# Patient Record
Sex: Female | Born: 2000 | Race: White | Hispanic: No | Marital: Single | Smoking: Current every day smoker
Health system: Southern US, Community
[De-identification: ages and names within clinical notes are randomized; demographics above are authoritative.]

## PROBLEM LIST (undated history)

## (undated) DIAGNOSIS — J189 Pneumonia, unspecified organism: Secondary | ICD-10-CM

## (undated) DIAGNOSIS — T7840XA Allergy, unspecified, initial encounter: Secondary | ICD-10-CM

## (undated) DIAGNOSIS — F329 Major depressive disorder, single episode, unspecified: Secondary | ICD-10-CM

## (undated) DIAGNOSIS — M419 Scoliosis, unspecified: Secondary | ICD-10-CM

## (undated) DIAGNOSIS — F32A Depression, unspecified: Secondary | ICD-10-CM

## (undated) DIAGNOSIS — F419 Anxiety disorder, unspecified: Secondary | ICD-10-CM

## (undated) HISTORY — PX: WISDOM TOOTH EXTRACTION: SHX21

## (undated) HISTORY — PX: TONSILLECTOMY: SUR1361

---
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
## 2019-02-01 ENCOUNTER — Telehealth: Payer: Self-pay | Admitting: Family Medicine

## 2019-02-01 NOTE — Telephone Encounter (Signed)
-----   Message from Shelly Coss, RN sent at 01/21/2019 11:57 AM EDT ----- Patient's mother called and is requesting appt for her daughter- please call her. Thanks!

## 2019-02-01 NOTE — Telephone Encounter (Signed)
Attempted to call patient to get her scheduled for an appointment her mother requested. Left a message for her to call back and get scheduled.

## 2019-02-16 ENCOUNTER — Telehealth: Payer: Self-pay | Admitting: Family Medicine

## 2019-02-16 NOTE — Telephone Encounter (Signed)
Patient was called and instructed about her visit for 02/17/2019.

## 2019-02-17 ENCOUNTER — Ambulatory Visit (INDEPENDENT_AMBULATORY_CARE_PROVIDER_SITE_OTHER): Payer: Medicaid Other | Admitting: Obstetrics and Gynecology

## 2019-02-17 ENCOUNTER — Other Ambulatory Visit: Payer: Self-pay

## 2019-02-17 ENCOUNTER — Encounter: Payer: Self-pay | Admitting: Obstetrics and Gynecology

## 2019-02-17 VITALS — BP 111/75 | HR 49 | Wt 176.0 lb

## 2019-02-17 DIAGNOSIS — Z30011 Encounter for initial prescription of contraceptive pills: Secondary | ICD-10-CM | POA: Diagnosis not present

## 2019-02-17 DIAGNOSIS — N83201 Unspecified ovarian cyst, right side: Secondary | ICD-10-CM | POA: Diagnosis not present

## 2019-02-17 DIAGNOSIS — R102 Pelvic and perineal pain: Secondary | ICD-10-CM | POA: Diagnosis not present

## 2019-02-17 MED ORDER — NORETHIN ACE-ETH ESTRAD-FE 1-20 MG-MCG(24) PO TABS: 1.0000 | ORAL_TABLET | Freq: Every day | ORAL | 11 refills | Status: DC

## 2019-02-17 NOTE — Progress Notes (Signed)
GYNECOLOGY OFFICE VISIT NOTE  History:  18 y.o. G0P0 here today for right pelvic pain. She has intermittent pain in right pelvis. Pain lasts 30-60 mins, sometimes can be a day apart, sometimes a few weeks apart. Sitting in the bathtub helps. Has been occurring for about 2 months. Rates it 10/10 when it happens. States she gets very tense when this happens. Has regular bowel movements, has been constipated in the last week or so. Routinely has 2-3 BM per day, formed and not requiring straining. No further issues with UTIs. Having normal monthly periods, starting beginning of the month. Lasting 5-7 days. She had nexplanon removed two months ago, which has helped with her cramps.     Past Medical History:  Diagnosis Date  . Allergy    POLLIN  . Anxiety   . Depression   . Pneumonia    AGE 72  . Scoliosis    SLIGHT    Past Surgical History:  Procedure Laterality Date  . ABDOMINAL HYSTERECTOMY Left 12/04/2017   Procedure: ABDOMINAL OVARIAN CYSTECTOMY;  Surgeon: Sloan Leiter, MD;  Location: [facility];  Service: Gynecology;  Laterality: Left;  ovarian cyst removal THIS IS NOT A HYSTERECTOMY  . TONSILLECTOMY    . WISDOM TOOTH EXTRACTION       Current Outpatient Medications:  .  etonogestrel-ethinyl estradiol (NUVARING) 0.12-0.015 MG/24HR vaginal ring, Insert vaginally and leave in place for 4 consecutive weeks, then remove and put in a new one., Disp: 1 each, Rfl: 12 .  Fe Bisgly-Succ-C-Thre-B12-FA (IRON-150 PO), Take by mouth., Disp: , Rfl:  .  ibuprofen (ADVIL,MOTRIN) 600 MG tablet, Take 1 tablet (600 mg total) by mouth every 6 (six) hours as needed., Disp: 30 tablet, Rfl: 0 .  loratadine (CLARITIN) 10 MG tablet, Take 10 mg by mouth daily as needed for allergies. , Disp: , Rfl:  .  oxyCODONE-acetaminophen (PERCOCET/ROXICET) 5-325 MG tablet, Take 1 tablet by mouth every 4 (four) hours as needed for severe pain., Disp: 15 tablet, Rfl: 0 .  Potassium 75 MG TABS, Take by mouth. Over the  counter, Disp: , Rfl:  .  sertraline (ZOLOFT) 100 MG tablet, Take 150 mg by mouth daily. , Disp: , Rfl: 0  The following portions of the patient's history were reviewed and updated as appropriate: allergies, current medications, past family history, past medical history, past social history, past surgical history and problem list.   Review of Systems:  Pertinent items noted in HPI and remainder of comprehensive ROS otherwise negative.   Objective:  Physical Exam BP 111/75   Pulse (!) 49   Wt 176 lb (79.8 kg)   BMI 29.29 kg/m  CONSTITUTIONAL: Well-developed, well-nourished female in no acute distress.  HENT:  Normocephalic, atraumatic. External right and left ear normal. Oropharynx is clear and moist EYES: Conjunctivae and EOM are normal. Pupils are equal, round, and reactive to light. No scleral icterus.  NECK: Normal range of motion, supple, no masses SKIN: Skin is warm and dry. No rash noted. Not diaphoretic. No erythema. No pallor. NEUROLOGIC: Alert and oriented to person, place, and time. Normal reflexes, muscle tone coordination. No cranial nerve deficit noted. PSYCHIATRIC: Normal mood and affect. Normal behavior. Normal judgment and thought content. CARDIOVASCULAR: Normal heart rate noted RESPIRATORY: Effort normal, no problems with respiration noted ABDOMEN: Soft, no distention noted. Pain not currently occurring, no tenderness with palpation.   PELVIC: deferred MUSCULOSKELETAL: Normal range of motion. No edema noted.  Labs and Imaging No results found.  Assessment &  Plan:   1. Pelvic pain - Reviewed possible etiologies, likely hemorrhagic cyst, for which treatment would be supportive, reviewed reasons to present to ED, particularly with severe pain. Also possibility of GI source, although patient has consistent BM and localizes pain to right pelvis.  - will repeat US to see if cyst noted on 09/2018 ultrasound is present/enlarged - US PELVIC COMPLETE WITH TRANSVAGINAL;  Future - POCT urine pregnancy  2. Encounter for initial prescription of contraceptive pills Using condoms, reviewed not as effective as nexplanon, would like OCPs, sent to pharmacy  3. Cyst of right ovary Repeat US   Routine preventative health maintenance measures emphasized. Please refer to After Visit Summary for other counseling recommendations.   No follow-ups on file.   Total face-to-face time with patient: 15 minutes. Over 50% of encounter was spent on counseling and coordination of care.   Feliz Beam, M.D. Attending Center for [employer] [facility])

## 2019-02-25 ENCOUNTER — Ambulatory Visit (HOSPITAL_COMMUNITY): Payer: Medicaid Other

## 2019-03-29 ENCOUNTER — Ambulatory Visit (HOSPITAL_COMMUNITY)
Admission: RE | Admit: 2019-03-29 | Discharge: 2019-03-29 | Disposition: A | Discharge status: Home / Self Care | Payer: Medicaid Other | Source: Ambulatory Visit | Attending: Obstetrics and Gynecology | Admitting: Obstetrics and Gynecology

## 2019-03-29 ENCOUNTER — Other Ambulatory Visit: Payer: Self-pay | Admitting: Obstetrics and Gynecology

## 2019-03-29 ENCOUNTER — Other Ambulatory Visit: Payer: Self-pay

## 2019-03-29 DIAGNOSIS — R102 Pelvic and perineal pain: Secondary | ICD-10-CM | POA: Insufficient documentation

## 2019-04-26 ENCOUNTER — Telehealth: Payer: Self-pay | Admitting: *Deleted

## 2019-04-26 DIAGNOSIS — N949 Unspecified condition associated with female genital organs and menstrual cycle: Secondary | ICD-10-CM

## 2019-04-26 DIAGNOSIS — N9489 Other specified conditions associated with female genital organs and menstrual cycle: Secondary | ICD-10-CM

## 2019-04-26 DIAGNOSIS — R102 Pelvic and perineal pain: Secondary | ICD-10-CM

## 2019-04-26 NOTE — Telephone Encounter (Signed)
I called Tanayah's home number.  and heard a message voicemail box is full and cannot accept messages.  I called Jillien's mobile number and a female answered and said Kanesha not there. I asked her to tell her we are trying to reach her and to please call us back and may ask for me or speak to any clinical person. She said best number to reach her is [phone]. She said Shamara  Has been trying to reach [place].  Linda,RN

## 2019-04-26 NOTE — Telephone Encounter (Signed)
-----   Message from Sloan Leiter, MD sent at 04/24/2019  1:48 AM EDT ----- Please let patient know she has a right sided adnexal mass, unclear on [place] and I recommend we do an MR to assess what this is as this may be the source of her pain.

## 2019-04-27 ENCOUNTER — Telehealth: Payer: Self-pay | Admitting: Obstetrics and Gynecology

## 2019-04-27 NOTE — Telephone Encounter (Signed)
Patient is returning a call from the nurse.

## 2019-04-28 NOTE — Telephone Encounter (Signed)
Called patient at home number, no answer- left message to call us back for results. Called mobile number and a women answered stating she wasn't in right now. Asked she have the patient call us back, she states she will.

## 2019-04-28 NOTE — Telephone Encounter (Signed)
Morgan Duke called back and I informed her results and reccommedation per Dr.Davis. she voices understanding and is agreeable to MRI- she does ask if it can be done before 05/11/19 because she is moving to [place]. I informed her I will schedule and call her back.

## 2019-04-28 NOTE — Telephone Encounter (Addendum)
I scheduled MRI  for 05/05/19 at [facility].  Morgan Duke reports she had an allergic reaction in [place] when she had a scan - states she isn't sure if it was an MRI or CT scan but was in a tunnel and was fast. States they gave her medicine in her IV that made her body burn inside and got rash/ hives on her back . States they gave her benadryl and other medicine to help.  Explained to her for now we have her schedule for MRI without contrast but will send Dr.Davis message to see if she wants MRI with and without contrast- because she will need to order premedication due to her history of a reaction. I asked her if she could find out from family, records, etc if she had a CT or MRI in [place] and/ or the name of the IV med that caused her reaction. I instructed her to discuss this with the staff in the MRI office before her scan. She voices understanding.

## 2019-05-04 NOTE — Telephone Encounter (Signed)
I called Morgan Duke to tell her per Dr.Davis will do CT scan without contrast- I called and left message on her  Home and mobile number calling with information- please call us back. Linda,RN

## 2019-05-05 ENCOUNTER — Other Ambulatory Visit: Payer: Self-pay

## 2019-05-05 ENCOUNTER — Ambulatory Visit (HOSPITAL_COMMUNITY)
Admission: RE | Admit: 2019-05-05 | Discharge: 2019-05-05 | Disposition: A | Discharge status: Home / Self Care | Payer: Medicaid Other | Source: Ambulatory Visit | Attending: Obstetrics and Gynecology | Admitting: Obstetrics and Gynecology

## 2019-05-05 DIAGNOSIS — N9489 Other specified conditions associated with female genital organs and menstrual cycle: Secondary | ICD-10-CM | POA: Diagnosis not present

## 2019-05-05 DIAGNOSIS — R102 Pelvic and perineal pain unspecified side: Secondary | ICD-10-CM

## 2019-06-24 ENCOUNTER — Telehealth: Payer: Self-pay | Admitting: Obstetrics and Gynecology

## 2019-06-24 ENCOUNTER — Telehealth: Payer: Self-pay

## 2019-06-24 DIAGNOSIS — R102 Pelvic and perineal pain: Secondary | ICD-10-CM

## 2019-06-24 NOTE — Telephone Encounter (Addendum)
-----   Message from Sloan Leiter, MD sent at 06/23/2019  1:50 PM EDT ----- Please let patient know she has a right adnexal cystic lesion, not clear exactly what it is, and she needs a repeat MRI with/without contrast in 4-8 weeks. Would prefer to see her in person for discussion of follow up.  Scheduled MRI for December 10th @ 1300.  LM that we have scheduled you another MRI at [facility] of 08/05/19 @ 1300.  If you have any questions please call the office.

## 2019-06-24 NOTE — Telephone Encounter (Signed)
Attempted to call patient with her follow-up appointment w/ DAvis. No answer, left voicemail with the appointment information. Patient instructed to give the office a call if needing to reschedule. Reminder mailed

## 2019-06-28 NOTE — Telephone Encounter (Signed)
Attempted to contact pt.  LM with pt appts of MRI and f/u appt with Dr. Rosana Hoes.  Letter sent.

## 2019-06-28 NOTE — Progress Notes (Signed)
Attempted to contact pt and LM that I am calling in regards to her appt scheduled on 08/05/19 @ 1300 for her MRI.  We have also scheduled a follow up appt with provider on 12/17 @ 1055.  Letter sent.

## 2019-08-05 ENCOUNTER — Ambulatory Visit (HOSPITAL_COMMUNITY): Admission: RE | Admit: 2019-08-05 | Payer: Medicaid Other | Source: Ambulatory Visit

## 2019-08-12 ENCOUNTER — Encounter: Payer: Self-pay | Admitting: Obstetrics and Gynecology

## 2019-08-12 ENCOUNTER — Ambulatory Visit: Payer: Medicaid Other | Admitting: Obstetrics and Gynecology

## 2019-08-13 ENCOUNTER — Telehealth: Payer: Self-pay | Admitting: Obstetrics and Gynecology

## 2019-08-13 NOTE — Telephone Encounter (Signed)
Attempted to contact patient to get her rescheduled for her MRI and f/u w/ Rosana Hoes. No answer, left voicemail for patient to give the office a call back to have these appointments rescheduled.

## 2019-08-16 ENCOUNTER — Telehealth: Payer: Self-pay

## 2019-08-16 NOTE — Telephone Encounter (Addendum)
MRI scheduled for 1/6 @ 1200. Pt to arrive at 1130 to [facility] radiology. Pt to have follow up appt with Rosana Hoes, MD 09/05/18.  Called pt to notify of MRI appt at 1218. VM left for pt stating time and place of appt. Pt encouraged to call the office with any questions and call back number given.  Attempted to call pt at 1548; called [phone] . Pt's mother answered phone call and stated she received earlier message, but pt is not home at this time. Pt's mother states she will make sure pt is aware of appt times.

## 2019-08-30 ENCOUNTER — Telehealth: Payer: Self-pay | Admitting: Obstetrics and Gynecology

## 2019-08-30 NOTE — Telephone Encounter (Signed)
Attempted to call patient about changes made to her appointment. Dr. Rosana Hoes is not seeing patients in the office, but only virtually.

## 2019-09-01 ENCOUNTER — Other Ambulatory Visit: Payer: Self-pay

## 2019-09-01 ENCOUNTER — Ambulatory Visit (HOSPITAL_COMMUNITY)
Admission: RE | Admit: 2019-09-01 | Discharge: 2019-09-01 | Disposition: A | Discharge status: Home / Self Care | Payer: Medicaid Other | Source: Ambulatory Visit | Attending: Obstetrics and Gynecology | Admitting: Obstetrics and Gynecology

## 2019-09-01 DIAGNOSIS — R102 Pelvic and perineal pain: Secondary | ICD-10-CM

## 2019-09-06 ENCOUNTER — Ambulatory Visit: Payer: Medicaid Other | Admitting: Obstetrics and Gynecology

## 2019-09-07 ENCOUNTER — Telehealth: Payer: Self-pay | Admitting: Obstetrics and Gynecology

## 2019-09-07 NOTE — Telephone Encounter (Signed)
Called the patient to follow up regarding a concern from an in office visit due to an appointment change. A female answered with a 4 seconds of silent air and disconnected the call. The patient stated the home number on the account is her aunts number and she expressed concern about calling with updated information. She stated she would like to keep the number on the account however the mobile number is the best way to reach her as it was updated as of yesterday. She also stated her aunt setup her mychart. The account remained active as the patient was in office with her mother whom also requested information about her daughter's account however she is not listed as a person of contact. Due to the concern of her mother obtaining the mychart information, nothing was updated. If patient calls back as she stated she would after lunch if no one has contacted her the mychart concerns will be discussed.

## 2019-09-20 ENCOUNTER — Ambulatory Visit: Payer: Medicaid Other | Admitting: Family Medicine

## 2019-09-23 ENCOUNTER — Telehealth: Payer: Self-pay | Admitting: Obstetrics and Gynecology

## 2019-09-23 IMAGING — CT CT ABD-PELV W/ CM
2 of 4 series · 16 of 46 positions shown, 18 images · IV contrast (Omni 300)
Comparison: Right upper quadrant ultrasound earlier this day.

CLINICAL DATA: Abdominal pain with nausea and vomiting. Mid
abdominal mass visualized on right upper quadrant ultrasound.

EXAM:
CT ABDOMEN AND PELVIS WITH CONTRAST
TECHNIQUE: Multidetector CT imaging of the abdomen and pelvis was performed
using the standard protocol following bolus administration of
intravenous contrast.
CONTRAST:  100mL [id number] IOPAMIDOL ([id number]) INJECTION 61%

[Series 3: a/p w/ 5mm · axial · 0.93mm/px · z∈[+587,+1042]mm · 13 of 101 slices shown, 15 images]
[im 5/101  soft-tissue]
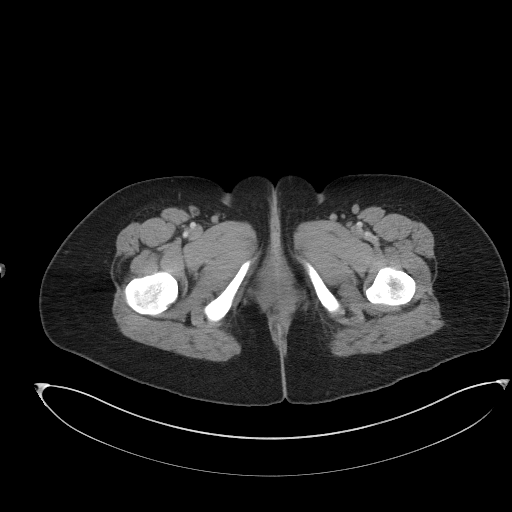
[im 5/101  bone]
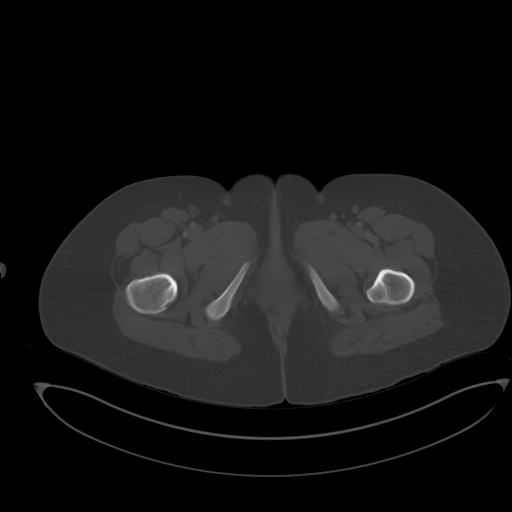
[im 13/101  soft-tissue]
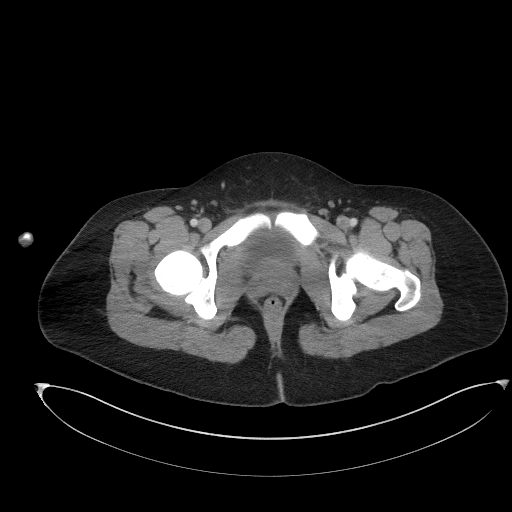
[im 21/101  soft-tissue]
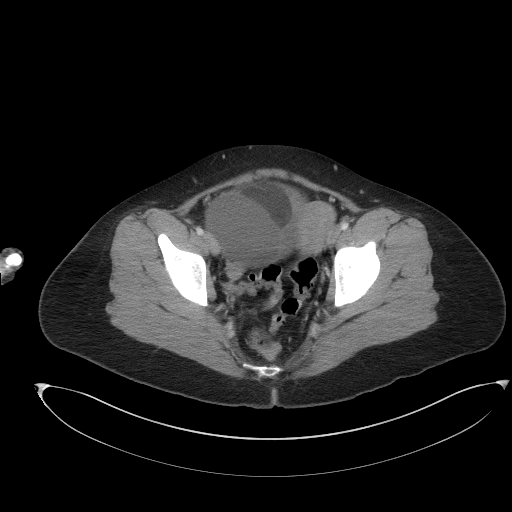
[im 30/101  soft-tissue]
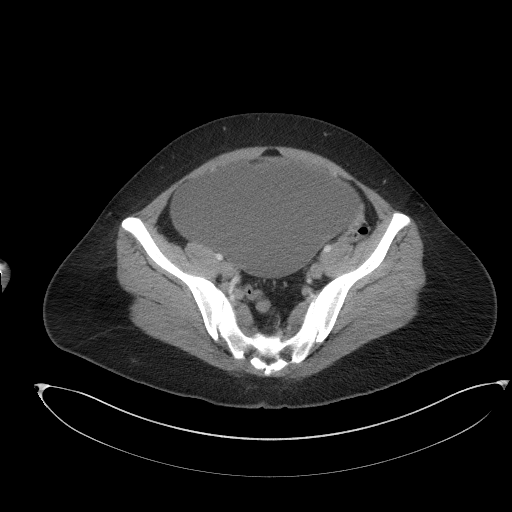
[im 34/101  soft-tissue]
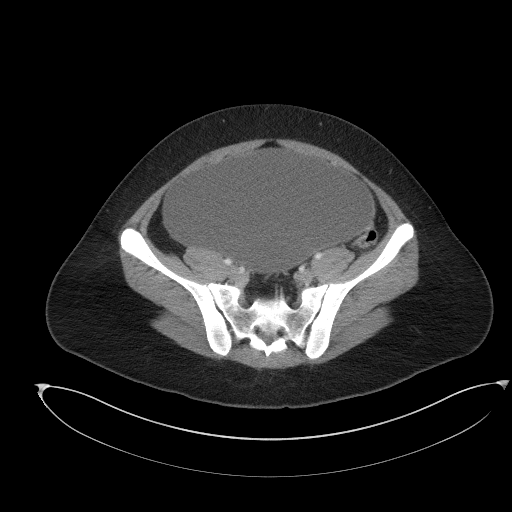
[im 42/101  soft-tissue]
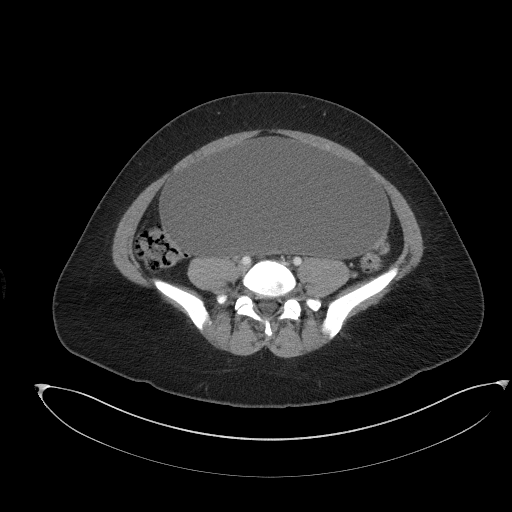
[im 51/101  soft-tissue]
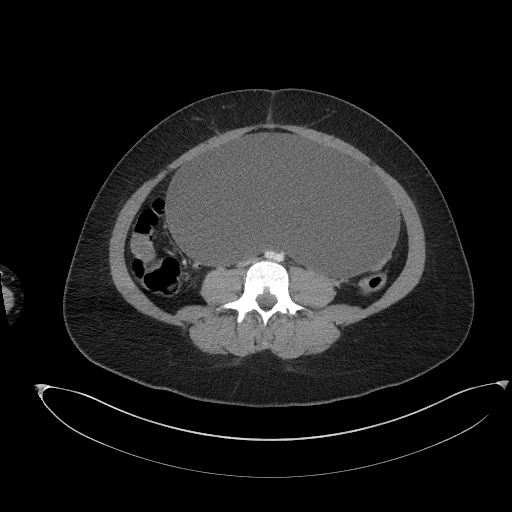
[im 59/101  soft-tissue]
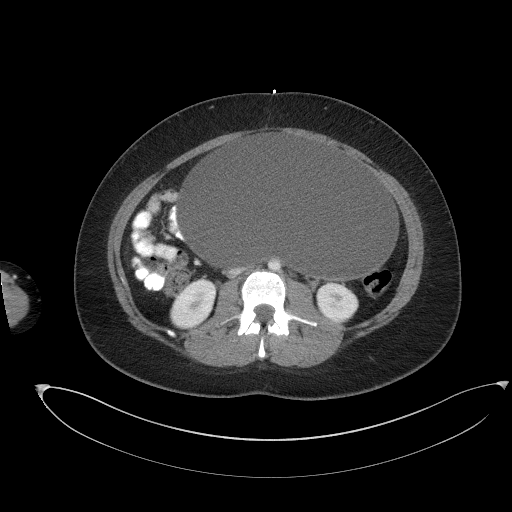
[im 67/101  soft-tissue]
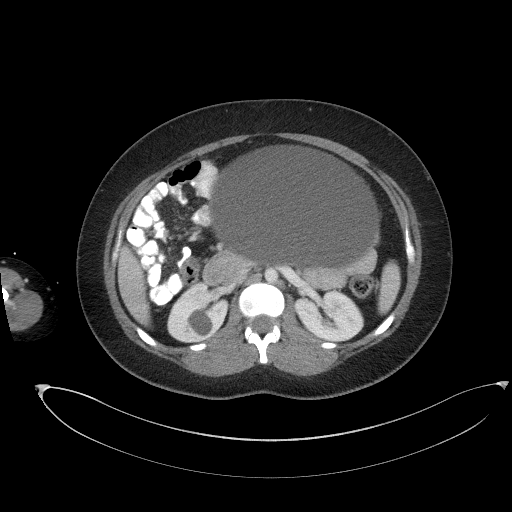
[im 67/101  bone]
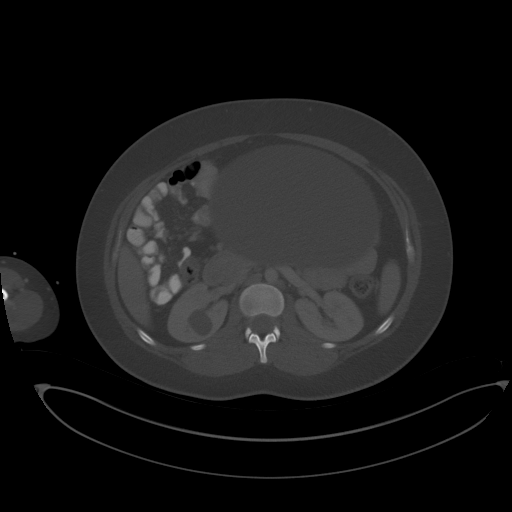
[im 71/101  soft-tissue]
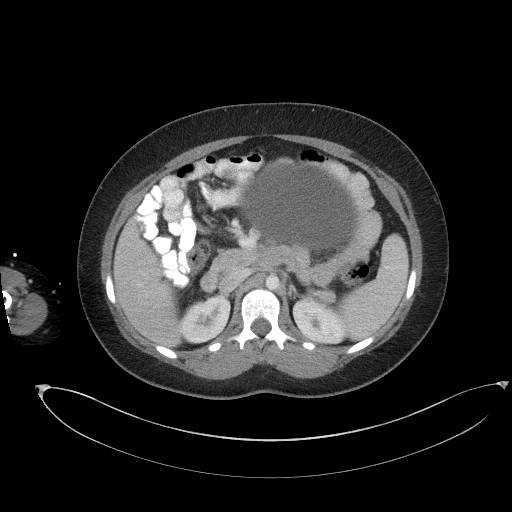
[im 80/101  soft-tissue]
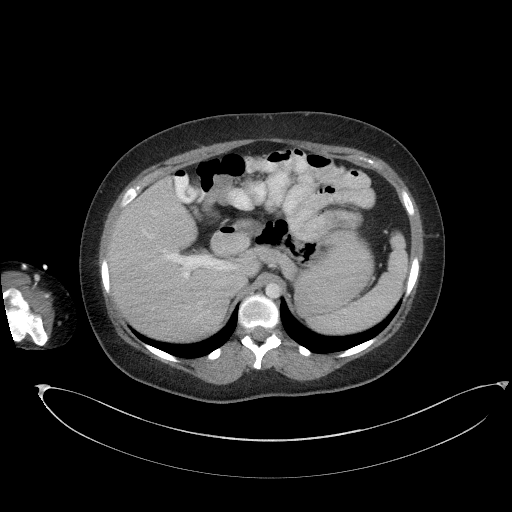
[im 88/101  soft-tissue]
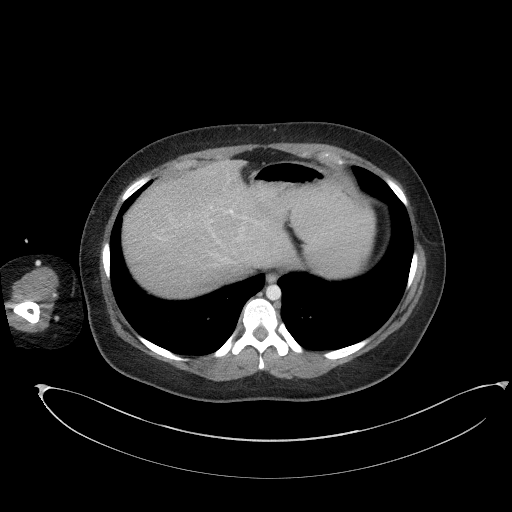
[im 96/101  soft-tissue]
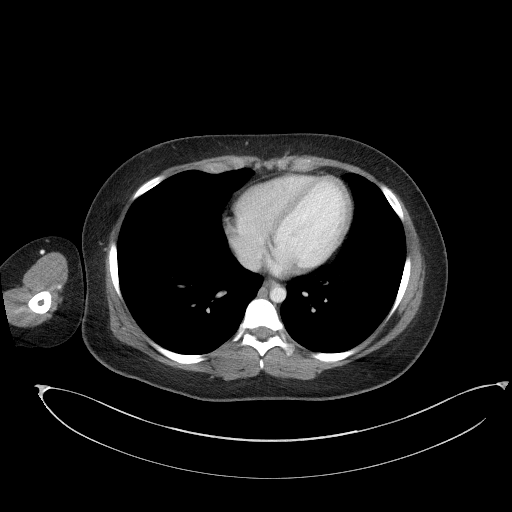

[Series 6: a/p w/ cor · coronal · 0.88mm/px · 3 of 151 slices shown]
[im 51/151  soft-tissue]
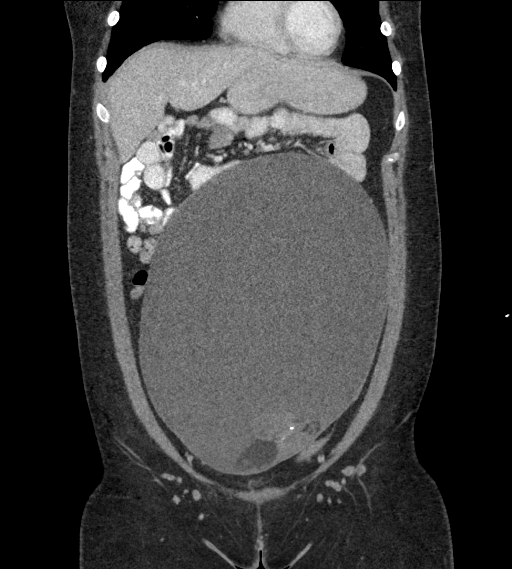
[im 67/151  soft-tissue]
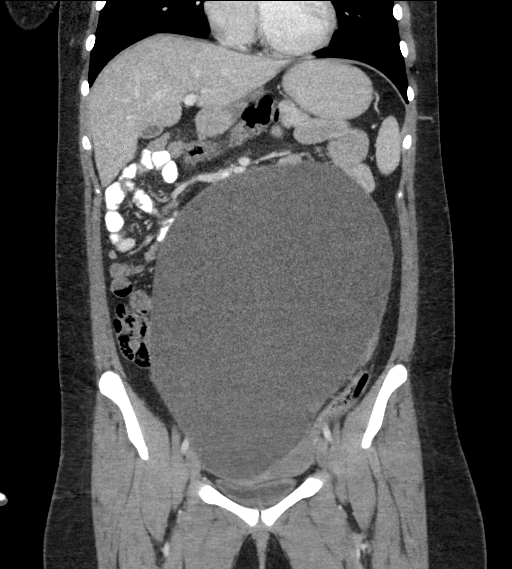
[im 84/151  soft-tissue]
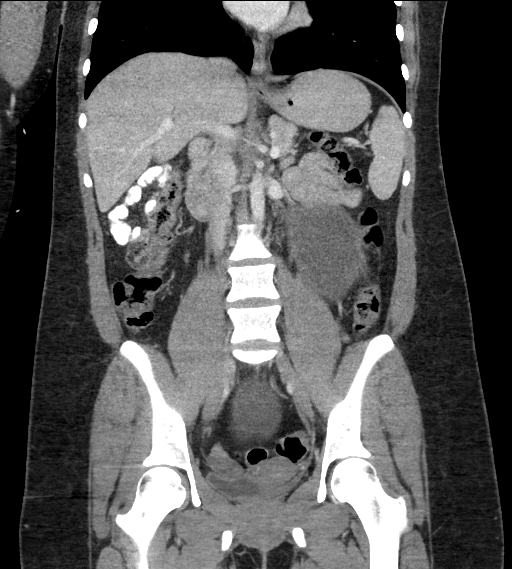

[16 of 46 positions shown; findings below may reference images not displayed]

FINDINGS: Lower chest: The lung bases are clear. No consolidation or pleural
effusion.

Hepatobiliary: No focal liver abnormality is seen. No gallstones,
gallbladder wall thickening, or biliary dilatation.

Pancreas: No ductal dilatation or inflammation.

Spleen: Normal in size without focal abnormality.

Adrenals/Urinary Tract: Normal adrenal glands. Mild right
hydronephrosis and proximal hydroureter. 15 mm right renal cyst.
External compression from intra-abdominal mass. No urolithiasis. No
left hydronephrosis. Urinary bladder is partially distended.

Stomach/Bowel: Bowel displaced peripherally by large
intra-abdominal/pelvic mass. No obstruction. Normal appendix. Small
volume of stool in the colon.

Vascular/Lymphatic: Mass-effect on the IVC by large
intra-abdominal/pelvic mass. Portal and mesenteric veins are patent.
Normal caliber abdominal aorta. No adenopathy.

Reproductive: Large mass filling the abdomen and pelvis is primarily
cystic, however has soft tissue solid, fat density, and calcific
components about the inferior aspect. This mass measures 27.5 x
X 11 cm (Volume = 1500 cm^3). This likely rises from the left
ovary, with the right ovary tentatively identified more inferiorly.
The uterus is displaced to the left.

Other: Trace pelvic free fluid, no ascites. No peritoneal
nodularity. No free air.

Musculoskeletal: There are no acute or suspicious osseous
abnormalities.
IMPRESSION: Large ovarian dermoid with primarily cystic, but some solid, fat and
calcific components measuring 27.5 x 21.7 x 11 cm filling the
abdominopelvic cavity. This likely rises from the left ovary. There
is mass effect on intra-abdominal and pelvic structures,
particularly, mild right hydronephrosis.

## 2019-09-23 NOTE — Telephone Encounter (Signed)
Called patient to discuss MRI and her reaction to contrast, no answer, will try again.   Feliz Beam, M.D. Attending Center for [employer] manager)

## 2019-10-05 ENCOUNTER — Telehealth: Payer: Self-pay

## 2019-10-05 DIAGNOSIS — R102 Pelvic and perineal pain: Secondary | ICD-10-CM

## 2019-10-05 NOTE — Telephone Encounter (Addendum)
-----   Message from Sloan Leiter, MD sent at 10/05/2019  2:57 PM EST ----- Please schedule for TVUS, complete, at her convenience. Thanks.  Confirmed with Dr. Rosana Hoes if she wants the MRI w/wo contrast due to pt having anaphylaxis rxn to the contrast.  Dr. Rosana Hoes stated that she preferred to have an [place] scheduled instead.  [place] scheduled for 10/11/19 @ 1345.  Pt notified of [place] appt and that she can access her appt info via [place].  Pt verbalized understanding.   Mel Almond, RN 10/05/19

## 2019-10-11 ENCOUNTER — Ambulatory Visit (HOSPITAL_COMMUNITY): Admission: RE | Admit: 2019-10-11 | Payer: Medicaid Other | Source: Ambulatory Visit

## 2019-10-15 ENCOUNTER — Telehealth: Payer: Self-pay | Admitting: Obstetrics & Gynecology

## 2019-10-15 NOTE — Telephone Encounter (Signed)
Called the patient on the mobile number. The line rang over 10x with no option for voicemail. Called the home number on file, the line is disconnected. Also attempted to reach the patient via mychart message.

## 2019-10-18 ENCOUNTER — Telehealth: Payer: Self-pay | Admitting: Family Medicine

## 2019-10-18 NOTE — Telephone Encounter (Signed)
The patient's mother called in stating she discussed that she cant come in until after 3/11 due to weather where she is located. The appointment with our department as well as ultrasound was rescheduled. The patient verbalized understanding.

## 2019-10-21 ENCOUNTER — Ambulatory Visit (HOSPITAL_COMMUNITY): Payer: Medicaid Other

## 2019-10-22 ENCOUNTER — Ambulatory Visit: Payer: Medicaid Other | Admitting: Family Medicine

## 2019-11-04 ENCOUNTER — Other Ambulatory Visit: Payer: Self-pay

## 2019-11-04 ENCOUNTER — Ambulatory Visit (HOSPITAL_COMMUNITY)
Admission: RE | Admit: 2019-11-04 | Discharge: 2019-11-04 | Disposition: A | Discharge status: Home / Self Care | Payer: Medicaid Other | Source: Ambulatory Visit | Attending: Obstetrics and Gynecology | Admitting: Obstetrics and Gynecology

## 2019-11-04 DIAGNOSIS — R102 Pelvic and perineal pain: Secondary | ICD-10-CM | POA: Insufficient documentation

## 2019-11-05 ENCOUNTER — Ambulatory Visit: Payer: Medicaid Other | Admitting: Obstetrics and Gynecology

## 2019-11-08 ENCOUNTER — Ambulatory Visit: Payer: Medicaid Other | Admitting: Obstetrics and Gynecology

## 2020-08-01 ENCOUNTER — Encounter (INDEPENDENT_AMBULATORY_CARE_PROVIDER_SITE_OTHER): Payer: Self-pay | Admitting: Student in an Organized Health Care Education/Training Program

## 2020-09-05 ENCOUNTER — Telehealth: Payer: Self-pay | Admitting: Obstetrics and Gynecology

## 2020-09-05 NOTE — Telephone Encounter (Signed)
Patient came to the office today to schedule a New OB appointment, she said her LMP was around 07/20/2020 she's really not for sure. patient was very adamant about getting schedule state she is scared she might have an Ectopic Pregnancy due to her surgery a few years ago.

## 2020-09-05 NOTE — Telephone Encounter (Signed)
I called Anjulie and a female who said she is Aulani's Mom answered . She said Shericka is not there. I asked her to give her a message I was returning her call and she can call us back tomorrow .  Will leave in in box for staff to call Zhana once more because she was adamant to registrar she wants to talk with nurse. Amit Meloy,RN

## 2020-09-06 ENCOUNTER — Encounter: Payer: Self-pay | Admitting: *Deleted

## 2020-09-06 NOTE — Telephone Encounter (Signed)
I called Morgan Duke and left a message on the voicemail. I stated that I am calling to speak with her and will send a Mychart message. I requested that she respond to the Mychart message or call the office back to speak with a nurse. Per review, pt presented proof of pregnancy from [facility] and New Ob appointment w/provider has been scheduled. Pt stated to scheduling staff that she has concerns for possible ectopic pregnancy and this needs to be discussed further.

## 2020-09-07 NOTE — Telephone Encounter (Signed)
Called patients mobile number [phone] and did not get a response. LM for her to call the office at her convenience, also asked her to check her My Chart message. My Chart message sent.   Called mobile phone [phone] and said was mobile phone of [employer]. Did not leave a message.   Called mother,  Morgan Duke number [phone] received message that call could not be completed at this time.

## 2020-09-25 ENCOUNTER — Encounter: Payer: Self-pay | Admitting: Obstetrics and Gynecology

## 2020-09-25 ENCOUNTER — Other Ambulatory Visit: Payer: Self-pay

## 2020-09-25 ENCOUNTER — Ambulatory Visit (INDEPENDENT_AMBULATORY_CARE_PROVIDER_SITE_OTHER): Payer: Self-pay | Admitting: Obstetrics and Gynecology

## 2020-09-25 DIAGNOSIS — N912 Amenorrhea, unspecified: Secondary | ICD-10-CM | POA: Insufficient documentation

## 2020-09-25 MED ORDER — ONDANSETRON 4 MG PO TBDP: 4.0000 mg | ORAL_TABLET | Freq: Four times a day (QID) | ORAL | 2 refills | Status: AC | PRN

## 2020-09-25 NOTE — Progress Notes (Signed)
Morgan Duke presents with c/o amenorrhea. LMP was end of November. She can not recall exact date. Monthly cycles till then Home UPT + 1/8 and 1/12. Urgent care UPT + 1/10. H/O left ovarian cystectomy d/t dermoid in 11/2017. Pt was confused and concerned about pregnancy and ectopic because of surgery. She was not sure if she could get pregnant. Discussion with family members added to her confusion and nervousness.  She reports feeling nauseated and fatigue which lead her to taking home UPT. She had negative Covid and Flu test prior.  PE AF VSS Lungs clear Heart RRR Abd soft + BS  A/P Amenorrhea  Discussed prior surgery with pt. Pt informed that she only had a left ovarian cystectomy. She still is able to achieve pregnancy and have a normal vaginal delivery unless otherwise dictated by maternal or fetal indications. Will check limited OB U/S for dates and pt reassurance. Pt already taking PNV. Rx for Zofran for N/V sent to pharmacy. Will start prenatal care as per U/S results.

## 2020-10-03 ENCOUNTER — Ambulatory Visit
Admission: RE | Admit: 2020-10-03 | Discharge: 2020-10-03 | Disposition: A | Discharge status: Home / Self Care | Payer: Medicaid Other | Source: Ambulatory Visit | Attending: Obstetrics and Gynecology | Admitting: Obstetrics and Gynecology

## 2020-10-03 ENCOUNTER — Telehealth: Payer: Self-pay | Admitting: Medical

## 2020-10-03 ENCOUNTER — Encounter: Payer: Self-pay | Admitting: Medical

## 2020-10-03 ENCOUNTER — Other Ambulatory Visit: Payer: Self-pay

## 2020-10-03 ENCOUNTER — Other Ambulatory Visit: Payer: Self-pay | Admitting: Obstetrics and Gynecology

## 2020-10-03 DIAGNOSIS — N912 Amenorrhea, unspecified: Secondary | ICD-10-CM

## 2020-10-03 NOTE — Telephone Encounter (Signed)
Morgan Duke returned my call today at 4:46 PM and I confirmed patient's identity using two patient identifiers. [place] results from earlier today were reviewed. Patient is not yet scheduled for new OB and will call when she is ready to schedule. First trimester warning signs reviewed. Patient voiced understanding and had no further questions.   US OB Comp Less 14 Wks  Result Date: 10/03/2020 CLINICAL DATA:  Amenorrhea EXAM: OBSTETRIC <14 WK ULTRASOUND TECHNIQUE: Transabdominal ultrasound was performed for evaluation of the gestation as well as the maternal uterus and adnexal regions. COMPARISON:  None. FINDINGS: Intrauterine gestational sac: Visualized-single Yolk sac:  Not visualized Embryo:  Visualized Cardiac Activity: Visualized Heart Rate: 173 bpm CRL:   27 mm   9 w for d                  [place] EDC: May 04, 2021 Subchorionic hemorrhage:  None visualized. Maternal uterus/adnexae: Cervical os is closed. Right ovary measures 4.4 x 2.3 x 3.2 cm. Left ovary measures 3.1 x 2.2 x 2.8 cm. No extrauterine pelvic mass. No free pelvic fluid. IMPRESSION: Single live intrauterine gestation with estimated gestational age of 22+ weeks. Electronically Signed   By: Lowella Grip III M.D.   On: 10/03/2020 15:26    Morgan Hough, PA-C 10/03/2020 4:46 PM

## 2020-10-03 NOTE — Telephone Encounter (Signed)
Attempted to contact Morgan Duke is [place] results from earlier today. Left VM to return call to my office or check MyChart. Sent MyChart message explaining results. Patient encouraged to start prenatal care at office of choice. First trimester warning signs reviewed.   Kerry Hough, PA-C 10/03/2020 4:19 PM

## 2020-10-18 ENCOUNTER — Encounter: Payer: Self-pay | Admitting: Family Medicine

## 2020-10-18 ENCOUNTER — Other Ambulatory Visit: Payer: Self-pay

## 2020-10-18 ENCOUNTER — Ambulatory Visit (INDEPENDENT_AMBULATORY_CARE_PROVIDER_SITE_OTHER): Payer: Medicaid Other | Admitting: Family Medicine

## 2020-10-18 ENCOUNTER — Other Ambulatory Visit: Payer: Self-pay | Admitting: Family Medicine

## 2020-10-18 VITALS — BP 130/72 | HR 72 | Wt 180.8 lb

## 2020-10-18 DIAGNOSIS — O219 Vomiting of pregnancy, unspecified: Secondary | ICD-10-CM

## 2020-10-18 DIAGNOSIS — Z3480 Encounter for supervision of other normal pregnancy, unspecified trimester: Secondary | ICD-10-CM | POA: Insufficient documentation

## 2020-10-18 MED ORDER — PROMETHAZINE HCL 12.5 MG PO TABS: 12.5000 mg | ORAL_TABLET | Freq: Four times a day (QID) | ORAL | 0 refills | Status: DC | PRN

## 2020-10-18 MED ORDER — SCOPOLAMINE 1 MG/3DAYS TD PT72
1.0000 | MEDICATED_PATCH | TRANSDERMAL | 12 refills | Status: DC
Start: 2020-10-18 — End: 2020-12-13

## 2020-10-18 MED ORDER — PROCHLORPERAZINE MALEATE 10 MG PO TABS: 10.0000 mg | ORAL_TABLET | Freq: Four times a day (QID) | ORAL | 0 refills | Status: DC | PRN

## 2020-10-18 NOTE — Progress Notes (Signed)
Subjective:   Morgan Duke is a 20 y.o. G1P0 at [redacted]w[redacted]d by early ultrasound being seen today for her first obstetrical visit.  Her obstetrical history is significant for none. Patient does intend to breast feed. Pregnancy history fully reviewed.  Patient reports nausea.  HISTORY: OB History  Gravida Para Term Preterm AB Living  1 0 0 0 0 0  SAB IAB Ectopic Multiple Live Births  0 0 0 0 0    # Outcome Date GA Lbr Len/2nd Weight Sex Delivery Anes PTL Lv  1 Current            Last pap smear was: n/a  Past Medical History:  Diagnosis Date  . Allergy    POLLIN  . Anxiety   . Depression   . Pneumonia    AGE 63  . Scoliosis    SLIGHT   Past Surgical History:  Procedure Laterality Date  . ABDOMINAL HYSTERECTOMY Left 12/04/2017   Procedure: ABDOMINAL OVARIAN CYSTECTOMY;  Surgeon: Sloan Leiter, MD;  Location: [facility];  Service: Gynecology;  Laterality: Left;  ovarian cyst removal THIS IS NOT A HYSTERECTOMY  . TONSILLECTOMY    . WISDOM TOOTH EXTRACTION     History reviewed. No pertinent family history. Social History   Tobacco Use  . Smoking status: Current Every Day Smoker    Types: E-cigarettes  . Smokeless tobacco: Never Used  Vaping Use  . Vaping Use: Every day  Substance Use Topics  . Alcohol use: No  . Drug use: Yes    Types: Marijuana   Allergies  Allergen Reactions  . Gadolinium Derivatives Shortness Of Breath    Pt states she had MRi in [place]  and felt like her body was on fire after contrast injection, pt also states she had difficulty breathing and throat "closed"  . Hydrocodone Other (See Comments)    Itchy throat Patient can take oxycodone without problems  . Thelma Barge Officinalis] Other (See Comments)    Unknown    Current Outpatient Medications on File Prior to Visit  Medication Sig Dispense Refill  . Fe Bisgly-Succ-C-Thre-B12-FA (IRON-150 PO) Take by mouth.    . ondansetron (ZOFRAN ODT) 4 MG disintegrating tablet Take 1 tablet (4 mg total)  by mouth every 6 (six) hours as needed for nausea. 20 tablet 2  . Prenatal Vit-Fe Fumarate-FA (PRENATAL MULTIVITAMIN) TABS tablet Take 1 tablet by mouth daily at 12 noon.     No current facility-administered medications on file prior to visit.     Exam   Vitals:   10/18/20 1147  BP: 130/72  Pulse: 72  Weight: 180 lb 12.8 oz (82 kg)   Fetal Heart Rate (bpm): 160  System: General: well-developed, well-nourished female in no acute distress   Skin: normal coloration and turgor, no rashes   Neurologic: oriented, normal, negative, normal mood   Extremities: normal strength, tone, and muscle mass, ROM of all joints is normal   HEENT PERRLA, extraocular movement intact and sclera clear, anicteric   Mouth/Teeth mucous membranes moist, pharynx normal without lesions and dental hygiene good   Neck supple and no masses   Cardiovascular: regular rate and rhythm   Respiratory:  no respiratory distress, normal breath sounds   Abdomen: soft, non-tender; bowel sounds normal; no masses,  no organomegaly   Pt informed that the ultrasound is considered a limited OB ultrasound and is not intended to be a complete ultrasound exam.  Patient also informed that the ultrasound is not being  completed with the intent of assessing for fetal or placental anomalies or any pelvic abnormalities.  Explained that the purpose of today's ultrasound is to assess for  heart rate and viability.  Patient acknowledges the purpose of the exam and the limitations of the study.    US shows viable IUP with normal somatic and cardiac movement   Assessment:   Pregnancy: G1P0 Patient Active Problem List   Diagnosis Date Noted  . Supervision of other normal pregnancy, antepartum 10/18/2020  . Amenorrhea 09/25/2020     Plan:  1. Supervision of other normal pregnancy, antepartum Initial labs drawn. Significant issues with nausea, given various medications to trial Continue prenatal vitamins. Genetic Screening discussed,  NIPS: ordered. Ultrasound discussed; fetal anatomic survey: ordered. Problem list reviewed and updated. The nature of [facility] with multiple MDs and other Advanced Practice Providers was explained to patient; also emphasized that residents, students are part of our team. - CBC/D/Plt+RPR+Rh+ABO+Rub Ab... - CHL AMB BABYSCRIPTS SCHEDULE OPTIMIZATION - Culture, OB Urine - Genetic Screening - Hemoglobin A1c   Routine obstetric precautions reviewed. Return in 4 weeks (on 11/15/2020).

## 2020-10-18 NOTE — Patient Instructions (Signed)
Obstetrics: Normal and Problem Pregnancies (7th ed., pp. 102-121). [place], [place]: Elsevier."> Textbook of Family Medicine (9th ed., pp. [phone]). [place], [place]: Else[employer].">  First Trimester of Pregnancy  The first trimester of pregnancy starts on the first day of your last menstrual period until the end of week 12. This is months 1 through 3 of pregnancy. A week after a sperm fertilizes an egg, the egg will implant into the wall of the uterus and begin to develop into a baby. By the end of 12 weeks, all the baby's organs will be formed and the baby will be 2-3 inches in size. Body changes during your first trimester Your body goes through many changes during pregnancy. The changes vary and generally return to normal after your baby is born. Physical changes  You may gain or lose weight.  Your breasts may begin to grow larger and become tender. The tissue that surrounds your nipples (areola) may become darker.  Dark spots or blotches (chloasma or mask of pregnancy) may develop on your face.  You may have changes in your hair. These can include thickening or thinning of your hair or changes in texture. Health changes  You may feel nauseous, and you may vomit.  You may have heartburn.  You may develop headaches.  You may develop constipation.  Your gums may bleed and may be sensitive to brushing and flossing. Other changes  You may tire easily.  You may urinate more often.  Your menstrual periods will stop.  You may have a loss of appetite.  You may develop cravings for certain kinds of food.  You may have changes in your emotions from day to day.  You may have more vivid and strange dreams. Follow these instructions at home: Medicines  Follow your health care provider's instructions regarding medicine use. Specific medicines may be either safe or unsafe to take during pregnancy. Do not take any medicines unless told to by your health care provider.  Take  a prenatal vitamin that contains at least 600 micrograms (mcg) of folic acid. Eating and drinking  Eat a healthy diet that includes fresh fruits and vegetables, whole grains, good sources of protein such as meat, eggs, or tofu, and low-fat dairy products.  Avoid raw meat and unpasteurized juice, milk, and cheese. These carry germs that can harm you and your baby.  If you feel nauseous or you vomit: ? Eat 4 or 5 small meals a day instead of 3 large meals. ? Try eating a few soda crackers. ? Drink liquids between meals instead of during meals.  You may need to take these actions to prevent or treat constipation: ? Drink enough fluid to keep your urine pale yellow. ? Eat foods that are high in fiber, such as beans, whole grains, and fresh fruits and vegetables. ? Limit foods that are high in fat and processed sugars, such as fried or sweet foods. Activity  Exercise only as directed by your health care provider. Most people can continue their usual exercise routine during pregnancy. Try to exercise for 30 minutes at least 5 days a week.  Stop exercising if you develop pain or cramping in the lower abdomen or lower back.  Avoid exercising if it is very hot or humid or if you are at high altitude.  Avoid heavy lifting.  If you choose to, you may have sex unless your health care provider tells you not to. Relieving pain and discomfort  Wear a good support bra to relieve  breast tenderness.  Rest with your legs elevated if you have leg cramps or low back pain.  If you develop bulging veins (varicose veins) in your legs: ? Wear support hose as told by your health care provider. ? Elevate your feet for 15 minutes, 3-4 times a day. ? Limit salt in your diet. Safety  Wear your seat belt at all times when driving or riding in a car.  Talk with your health care provider if someone is verbally or physically abusive to you.  Talk with your health care provider if you are feeling sad or have  thoughts of hurting yourself. Lifestyle  Do not use hot tubs, steam rooms, or saunas.  Do not douche. Do not use tampons or scented sanitary pads.  Do not use herbal remedies, alcohol, illegal drugs, or medicines that are not approved by your health care provider. Chemicals in these products can harm your baby.  Do not use any products that contain nicotine or tobacco, such as cigarettes, e-cigarettes, and chewing tobacco. If you need help quitting, ask your health care provider.  Avoid cat litter boxes and soil used by cats. These carry germs that can cause birth defects in the baby and possibly loss of the unborn baby (fetus) by miscarriage or stillbirth. General instructions  During routine prenatal visits in the first trimester, your health care provider will do a physical exam, perform necessary tests, and ask you how things are going. Keep all follow-up visits. This is important.  Ask for help if you have counseling or nutritional needs during pregnancy. Your health care provider can offer advice or refer you to specialists for help with various needs.  Schedule a dentist appointment. At home, brush your teeth with a soft toothbrush. Floss gently.  Write down your questions. Take them to your prenatal visits. Where to find more information  American Pregnancy Association: americanpregnancy.[place] and Gynecologists: PoolDevices.com.pt  Office on [employer]: KeywordPortfolios.com.br Contact a health care provider if you have:  Dizziness.  A fever.  Mild pelvic cramps, pelvic pressure, or nagging pain in the abdominal area.  Nausea, vomiting, or diarrhea that lasts for 24 hours or longer.  A bad-smelling vaginal discharge.  Pain when you urinate.  Known exposure to a contagious illness, such as chickenpox, measles, Zika virus, HIV, or hepatitis. Get help right away if you have:  Spotting or bleeding from your  vagina.  Severe abdominal cramping or pain.  Shortness of breath or chest pain.  Any kind of trauma, such as from a fall or a car crash.  New or increased pain, swelling, or redness in an arm or leg. Summary  The first trimester of pregnancy starts on the first day of your last menstrual period until the end of week 12 (months 1 through 3).  Eating 4 or 5 small meals a day rather than 3 large meals may help to relieve nausea and vomiting.  Do not use any products that contain nicotine or tobacco, such as cigarettes, e-cigarettes, and chewing tobacco. If you need help quitting, ask your health care provider.  Keep all follow-up visits. This is important. This information is not intended to replace advice given to you by your health care provider. Make sure you discuss any questions you have with your health care provider. Document Revised: 01/19/2020 Document Reviewed: 11/25/2019 Elsevier Patient Education  2021 [employer].   Contraception Choices Contraception, also called birth control, refers to methods or devices that prevent pregnancy. Hormonal  methods Contraceptive implant A contraceptive implant is a thin, plastic tube that contains a hormone that prevents pregnancy. It is different from an intrauterine device (IUD). It is inserted into the upper part of the arm by a health care provider. Implants can be effective for up to 3 years. Progestin-only injections Progestin-only injections are injections of progestin, a synthetic form of the hormone progesterone. They are given every 3 months by a health care provider. Birth control pills Birth control pills are pills that contain hormones that prevent pregnancy. They must be taken once a day, preferably at the same time each day. A prescription is needed to use this method of contraception. Birth control patch The birth control patch contains hormones that prevent pregnancy. It is placed on the skin and must be changed once a  week for three weeks and removed on the fourth week. A prescription is needed to use this method of contraception. Vaginal ring A vaginal ring contains hormones that prevent pregnancy. It is placed in the vagina for three weeks and removed on the fourth week. After that, the process is repeated with a new ring. A prescription is needed to use this method of contraception. Emergency contraceptive Emergency contraceptives prevent pregnancy after unprotected sex. They come in pill form and can be taken up to 5 days after sex. They work best the sooner they are taken after having sex. Most emergency contraceptives are available without a prescription. This method should not be used as your only form of birth control.   Barrier methods Female condom A female condom is a thin sheath that is worn over the penis during sex. Condoms keep sperm from going inside a woman's body. They can be used with a sperm-killing substance (spermicide) to increase their effectiveness. They should be thrown away after one use. Female condom A female condom is a soft, loose-fitting sheath that is put into the vagina before sex. The condom keeps sperm from going inside a woman's body. They should be thrown away after one use. Diaphragm A diaphragm is a soft, dome-shaped barrier. It is inserted into the vagina before sex, along with a spermicide. The diaphragm blocks sperm from entering the uterus, and the spermicide kills sperm. A diaphragm should be left in the vagina for 6-8 hours after sex and removed within 24 hours. A diaphragm is prescribed and fitted by a health care provider. A diaphragm should be replaced every 1-2 years, after giving birth, after gaining more than 15 lb (6.8 kg), and after pelvic surgery. Cervical cap A cervical cap is a round, soft latex or plastic cup that fits over the cervix. It is inserted into the vagina before sex, along with spermicide. It blocks sperm from entering the uterus. The cap should be  left in place for 6-8 hours after sex and removed within 48 hours. A cervical cap must be prescribed and fitted by a health care provider. It should be replaced every 2 years. Sponge A sponge is a soft, circular piece of polyurethane foam with spermicide in it. The sponge helps block sperm from entering the uterus, and the spermicide kills sperm. To use it, you make it wet and then insert it into the vagina. It should be inserted before sex, left in for at least 6 hours after sex, and removed and thrown away within 30 hours. Spermicides Spermicides are chemicals that kill or block sperm from entering the cervix and uterus. They can come as a cream, jelly, suppository, foam, or tablet. A spermicide  should be inserted into the vagina with an applicator at least 86-76 minutes before sex to allow time for it to work. The process must be repeated every time you have sex. Spermicides do not require a prescription.   Intrauterine contraception Intrauterine device (IUD) An IUD is a T-shaped device that is put in a woman's uterus. There are two types:  Hormone IUD.This type contains progestin, a synthetic form of the hormone progesterone. This type can stay in place for 3-5 years.  Copper IUD.This type is wrapped in copper wire. It can stay in place for 10 years. Permanent methods of contraception Female tubal ligation In this method, a woman's fallopian tubes are sealed, tied, or blocked during surgery to prevent eggs from traveling to the uterus. Hysteroscopic sterilization In this method, a small, flexible insert is placed into each fallopian tube. The inserts cause scar tissue to form in the fallopian tubes and block them, so sperm cannot reach an egg. The procedure takes about 3 months to be effective. Another form of birth control must be used during those 3 months. Female sterilization This is a procedure to tie off the tubes that carry sperm (vasectomy). After the procedure, the man can still  ejaculate fluid (semen). Another form of birth control must be used for 3 months after the procedure. Natural planning methods Natural family planning In this method, a couple does not have sex on days when the woman could become pregnant. Calendar method In this method, the woman keeps track of the length of each menstrual cycle, identifies the days when pregnancy can happen, and does not have sex on those days. Ovulation method In this method, a couple avoids sex during ovulation. Symptothermal method This method involves not having sex during ovulation. The woman typically checks for ovulation by watching changes in her temperature and in the consistency of cervical mucus. Post-ovulation method In this method, a couple waits to have sex until after ovulation. Where to find more information  Centers for Disease Control and Prevention: http://www.wolf.info/ Summary  Contraception, also called birth control, refers to methods or devices that prevent pregnancy.  Hormonal methods of contraception include implants, injections, pills, patches, vaginal rings, and emergency contraceptives.  Barrier methods of contraception can include female condoms, female condoms, diaphragms, cervical caps, sponges, and spermicides.  There are two types of IUDs (intrauterine devices). An IUD can be put in a woman's uterus to prevent pregnancy for 3-5 years.  Permanent sterilization can be done through a procedure for males and females. Natural family planning methods involve nothaving sex on days when the woman could become pregnant. This information is not intended to replace advice given to you by your health care provider. Make sure you discuss any questions you have with your health care provider. Document Revised: 01/17/2020 Document Reviewed: 01/17/2020 Elsevier Patient Education  [place].   Breastfeeding  Choosing to breastfeed is one of the best decisions you can make for yourself and your baby. A  change in hormones during pregnancy causes your breasts to make breast milk in your milk-producing glands. Hormones prevent breast milk from being released before your baby is born. They also prompt milk flow after birth. Once breastfeeding has begun, thoughts of your baby, as well as his or her sucking or crying, can stimulate the release of milk from your milk-producing glands. Benefits of breastfeeding Research shows that breastfeeding offers many health benefits for infants and mothers. It also offers a cost-free and convenient way to feed your baby.  For your baby  Your first milk (colostrum) helps your baby's digestive system to function better.  Special cells in your milk (antibodies) help your baby to fight off infections.  Breastfed babies are less likely to develop asthma, allergies, obesity, or type 2 diabetes. They are also at lower risk for sudden infant death syndrome (SIDS).  Nutrients in breast milk are better able to meet your baby's needs compared to infant formula.  Breast milk improves your baby's brain development. For you  Breastfeeding helps to create a very special bond between you and your baby.  Breastfeeding is convenient. Breast milk costs nothing and is always available at the correct temperature.  Breastfeeding helps to burn calories. It helps you to lose the weight that you gained during pregnancy.  Breastfeeding makes your uterus return faster to its size before pregnancy. It also slows bleeding (lochia) after you give birth.  Breastfeeding helps to lower your risk of developing type 2 diabetes, osteoporosis, rheumatoid arthritis, cardiovascular disease, and breast, ovarian, uterine, and endometrial cancer later in life. Breastfeeding basics Starting breastfeeding  Find a comfortable place to sit or lie down, with your neck and back well-supported.  Place a pillow or a rolled-up blanket under your baby to bring him or her to the level of your breast (if  you are seated). Nursing pillows are specially designed to help support your arms and your baby while you breastfeed.  Make sure that your baby's tummy (abdomen) is facing your abdomen.  Gently massage your breast. With your fingertips, massage from the outer edges of your breast inward toward the nipple. This encourages milk flow. If your milk flows slowly, you may need to continue this action during the feeding.  Support your breast with 4 fingers underneath and your thumb above your nipple (make the letter "C" with your hand). Make sure your fingers are well away from your nipple and your baby's mouth.  Stroke your baby's lips gently with your finger or nipple.  When your baby's mouth is open wide enough, quickly bring your baby to your breast, placing your entire nipple and as much of the areola as possible into your baby's mouth. The areola is the colored area around your nipple. ? More areola should be visible above your baby's upper lip than below the lower lip. ? Your baby's lips should be opened and extended outward (flanged) to ensure an adequate, comfortable latch. ? Your baby's tongue should be between his or her lower gum and your breast.  Make sure that your baby's mouth is correctly positioned around your nipple (latched). Your baby's lips should create a seal on your breast and be turned out (everted).  It is common for your baby to suck about 2-3 minutes in order to start the flow of breast milk. Latching Teaching your baby how to latch onto your breast properly is very important. An improper latch can cause nipple pain, decreased milk supply, and poor weight gain in your baby. Also, if your baby is not latched onto your nipple properly, he or she may swallow some air during feeding. This can make your baby fussy. Burping your baby when you switch breasts during the feeding can help to get rid of the air. However, teaching your baby to latch on properly is still the best way to  prevent fussiness from swallowing air while breastfeeding. Signs that your baby has successfully latched onto your nipple  Silent tugging or silent sucking, without causing you pain. Infant's lips should be  extended outward (flanged).  Swallowing heard between every 3-4 sucks once your milk has started to flow (after your let-down milk reflex occurs).  Muscle movement above and in front of his or her ears while sucking. Signs that your baby has not successfully latched onto your nipple  Sucking sounds or smacking sounds from your baby while breastfeeding.  Nipple pain. If you think your baby has not latched on correctly, slip your finger into the corner of your baby's mouth to break the suction and place it between your baby's gums. Attempt to start breastfeeding again. Signs of successful breastfeeding Signs from your baby  Your baby will gradually decrease the number of sucks or will completely stop sucking.  Your baby will fall asleep.  Your baby's body will relax.  Your baby will retain a small amount of milk in his or her mouth.  Your baby will let go of your breast by himself or herself. Signs from you  Breasts that have increased in firmness, weight, and size 1-3 hours after feeding.  Breasts that are softer immediately after breastfeeding.  Increased milk volume, as well as a change in milk consistency and color by the fifth day of breastfeeding.  Nipples that are not sore, cracked, or bleeding. Signs that your baby is getting enough milk  Wetting at least 1-2 diapers during the first 24 hours after birth.  Wetting at least 5-6 diapers every 24 hours for the first week after birth. The urine should be clear or pale yellow by the age of 5 days.  Wetting 6-8 diapers every 24 hours as your baby continues to grow and develop.  At least 3 stools in a 24-hour period by the age of 5 days. The stool should be soft and yellow.  At least 3 stools in a 24-hour period by the  age of 7 days. The stool should be seedy and yellow.  No loss of weight greater than 10% of birth weight during the first 3 days of life.  Average weight gain of 4-7 oz (113-198 g) per week after the age of 4 days.  Consistent daily weight gain by the age of 5 days, without weight loss after the age of 2 weeks. After a feeding, your baby may spit up a small amount of milk. This is normal. Breastfeeding frequency and duration Frequent feeding will help you make more milk and can prevent sore nipples and extremely full breasts (breast engorgement). Breastfeed when you feel the need to reduce the fullness of your breasts or when your baby shows signs of hunger. This is called "breastfeeding on demand." Signs that your baby is hungry include:  Increased alertness, activity, or restlessness.  Movement of the head from side to side.  Opening of the mouth when the corner of the mouth or cheek is stroked (rooting).  Increased sucking sounds, smacking lips, cooing, sighing, or squeaking.  Hand-to-mouth movements and sucking on fingers or hands.  Fussing or crying. Avoid introducing a pacifier to your baby in the first 4-6 weeks after your baby is born. After this time, you may choose to use a pacifier. Research has shown that pacifier use during the first year of a baby's life decreases the risk of sudden infant death syndrome (SIDS). Allow your baby to feed on each breast as long as he or she wants. When your baby unlatches or falls asleep while feeding from the first breast, offer the second breast. Because newborns are often sleepy in the first few weeks of  life, you may need to awaken your baby to get him or her to feed. Breastfeeding times will vary from baby to baby. However, the following rules can serve as a guide to help you make sure that your baby is properly fed:  Newborns (babies 60 weeks of age or younger) may breastfeed every 1-3 hours.  Newborns should not go without breastfeeding  for longer than 3 hours during the day or 5 hours during the night.  You should breastfeed your baby a minimum of 8 times in a 24-hour period. Breast milk pumping Pumping and storing breast milk allows you to make sure that your baby is exclusively fed your breast milk, even at times when you are unable to breastfeed. This is especially important if you go back to work while you are still breastfeeding, or if you are not able to be present during feedings. Your lactation consultant can help you find a method of pumping that works best for you and give you guidelines about how long it is safe to store breast milk.      Caring for your breasts while you breastfeed Nipples can become dry, cracked, and sore while breastfeeding. The following recommendations can help keep your breasts moisturized and healthy:  Avoid using soap on your nipples.  Wear a supportive bra designed especially for nursing. Avoid wearing underwire-style bras or extremely tight bras (sports bras).  Air-dry your nipples for 3-4 minutes after each feeding.  Use only cotton bra pads to absorb leaked breast milk. Leaking of breast milk between feedings is normal.  Use lanolin on your nipples after breastfeeding. Lanolin helps to maintain your skin's normal moisture barrier. Pure lanolin is not harmful (not toxic) to your baby. You may also hand express a few drops of breast milk and gently massage that milk into your nipples and allow the milk to air-dry. In the first few weeks after giving birth, some women experience breast engorgement. Engorgement can make your breasts feel heavy, warm, and tender to the touch. Engorgement peaks within 3-5 days after you give birth. The following recommendations can help to ease engorgement:  Completely empty your breasts while breastfeeding or pumping. You may want to start by applying warm, moist heat (in the shower or with warm, water-soaked hand towels) just before feeding or pumping. This  increases circulation and helps the milk flow. If your baby does not completely empty your breasts while breastfeeding, pump any extra milk after he or she is finished.  Apply ice packs to your breasts immediately after breastfeeding or pumping, unless this is too uncomfortable for you. To do this: ? Put ice in a plastic bag. ? Place a towel between your skin and the bag. ? Leave the ice on for 20 minutes, 2-3 times a day.  Make sure that your baby is latched on and positioned properly while breastfeeding. If engorgement persists after 48 hours of following these recommendations, contact your health care provider or a Science writer. Overall health care recommendations while breastfeeding  Eat 3 healthy meals and 3 snacks every day. Well-nourished mothers who are breastfeeding need an additional 450-500 calories a day. You can meet this requirement by increasing the amount of a balanced diet that you eat.  Drink enough water to keep your urine pale yellow or clear.  Rest often, relax, and continue to take your prenatal vitamins to prevent fatigue, stress, and low vitamin and mineral levels in your body (nutrient deficiencies).  Do not use any products that contain  nicotine or tobacco, such as cigarettes and e-cigarettes. Your baby may be harmed by chemicals from cigarettes that pass into breast milk and exposure to secondhand smoke. If you need help quitting, ask your health care provider.  Avoid alcohol.  Do not use illegal drugs or marijuana.  Talk with your health care provider before taking any medicines. These include over-the-counter and prescription medicines as well as vitamins and herbal supplements. Some medicines that may be harmful to your baby can pass through breast milk.  It is possible to become pregnant while breastfeeding. If birth control is desired, ask your health care provider about options that will be safe while breastfeeding your baby. Where to find more  information: [employer]: www.llli.org Contact a health care provider if:  You feel like you want to stop breastfeeding or have become frustrated with breastfeeding.  Your nipples are cracked or bleeding.  Your breasts are red, tender, or warm.  You have: ? Painful breasts or nipples. ? A swollen area on either breast. ? A fever or chills. ? Nausea or vomiting. ? Drainage other than breast milk from your nipples.  Your breasts do not become full before feedings by the fifth day after you give birth.  You feel sad and depressed.  Your baby is: ? Too sleepy to eat well. ? Having trouble sleeping. ? More than 75 week old and wetting fewer than 6 diapers in a 24-hour period. ? Not gaining weight by 25 days of age.  Your baby has fewer than 3 stools in a 24-hour period.  Your baby's skin or the white parts of his or her eyes become yellow. Get help right away if:  Your baby is overly tired (lethargic) and does not want to wake up and feed.  Your baby develops an unexplained fever. Summary  Breastfeeding offers many health benefits for infant and mothers.  Try to breastfeed your infant when he or she shows early signs of hunger.  Gently tickle or stroke your baby's lips with your finger or nipple to allow the baby to open his or her mouth. Bring the baby to your breast. Make sure that much of the areola is in your baby's mouth. Offer one side and burp the baby before you offer the other side.  Talk with your health care provider or lactation consultant if you have questions or you face problems as you breastfeed. This information is not intended to replace advice given to you by your health care provider. Make sure you discuss any questions you have with your health care provider. Document Revised: 11/06/2017 Document Reviewed: 09/13/2016 Elsevier Patient Education  2021 [employer].

## 2020-10-19 LAB — CBC/D/PLT+RPR+RH+ABO+RUB AB...
Antibody Screen: NEGATIVE
Basophils Absolute: 0 10*3/uL (ref 0.0–0.2)
Basos: 1 %
EOS (ABSOLUTE): 0.2 10*3/uL (ref 0.0–0.4)
Eos: 3 %
HCV Ab: <0.1 s/co ratio (ref 0.0–0.9)
HIV Screen 4th Generation wRfx: NONREACTIVE
Hematocrit: 36.6 % (ref 34.0–46.6)
Hemoglobin: 12.7 g/dL (ref 11.1–15.9)
Hepatitis B Surface Ag: NEGATIVE
Immature Grans (Abs): 0 10*3/uL (ref 0.0–0.1)
Immature Granulocytes: 0 %
Lymphocytes Absolute: 1.9 10*3/uL (ref 0.7–3.1)
Lymphs: 26 %
MCH: 31.6 pg (ref 26.6–33.0)
MCHC: 34.7 g/dL (ref 31.5–35.7)
MCV: 91 fL (ref 79–97)
Monocytes Absolute: 0.4 10*3/uL (ref 0.1–0.9)
Monocytes: 5 %
Neutrophils Absolute: 4.8 10*3/uL (ref 1.4–7.0)
Neutrophils: 65 %
Platelets: 203 10*3/uL (ref 150–450)
RBC: 4.02 x10E6/uL (ref 3.77–5.28)
RDW: 12.5 % (ref 11.7–15.4)
RPR Ser Ql: NONREACTIVE
Rh Factor: POSITIVE
Rubella Antibodies, IGG: 1.82 index (ref >0.99)
WBC: 7.2 10*3/uL (ref 3.4–10.8)

## 2020-10-19 LAB — HEMOGLOBIN A1C
Est. average glucose Bld gHb Est-mCnc: 94 mg/dL
Hgb A1c MFr Bld: 4.9 % (ref 4.8–5.6)

## 2020-10-19 LAB — HCV INTERPRETATION

## 2020-10-20 ENCOUNTER — Telehealth: Payer: Self-pay

## 2020-10-20 NOTE — Telephone Encounter (Signed)
Spoke with patient and r/s her appt to 12/07/20 due to MFM being closed for Good Friday. Confirmed appointment with patient.

## 2020-10-31 ENCOUNTER — Encounter: Payer: Self-pay | Admitting: General Practice

## 2020-11-15 ENCOUNTER — Other Ambulatory Visit: Payer: Self-pay

## 2020-11-15 ENCOUNTER — Ambulatory Visit (INDEPENDENT_AMBULATORY_CARE_PROVIDER_SITE_OTHER): Payer: Medicaid Other | Admitting: Obstetrics & Gynecology

## 2020-11-15 ENCOUNTER — Encounter: Payer: Self-pay | Admitting: Obstetrics & Gynecology

## 2020-11-15 VITALS — BP 102/67 | HR 83 | Wt 179.5 lb

## 2020-11-15 DIAGNOSIS — Z3480 Encounter for supervision of other normal pregnancy, unspecified trimester: Secondary | ICD-10-CM

## 2020-11-15 DIAGNOSIS — Z3A15 15 weeks gestation of pregnancy: Secondary | ICD-10-CM

## 2020-11-15 NOTE — Progress Notes (Signed)
   PRENATAL VISIT NOTE  Subjective:  Morgan Duke is a 20 y.o. G1P0 at [redacted]w[redacted]d being seen today for ongoing prenatal care.  She is currently monitored for the following issues for this low-risk pregnancy and has Amenorrhea; Supervision of other normal pregnancy, antepartum; ADD (attention deficit disorder); and Anxiety and depression on their problem list.  Patient reports no complaints.  Contractions: Not present. Vag. Bleeding: None.  Movement: Absent. Denies leaking of fluid.   The following portions of the patient's history were reviewed and updated as appropriate: allergies, current medications, past family history, past medical history, past social history, past surgical history and problem list.   Objective:   Vitals:   11/15/20 1011  BP: 102/67  Pulse: 83  Weight: 179 lb 8 oz (81.4 kg)    Fetal Status: Fetal Heart Rate (bpm): 150   Movement: Absent     General:  Alert, oriented and cooperative. Patient is in no acute distress.  Skin: Skin is warm and dry. No rash noted.   Cardiovascular: Normal heart rate noted  Respiratory: Normal respiratory effort, no problems with respiration noted  Abdomen: Soft, gravid, appropriate for gestational age.  Pain/Pressure: Present     Pelvic: Cervical exam deferred        Extremities: Normal range of motion.  Edema: None  Mental Status: Normal mood and affect. Normal behavior. Normal judgment and thought content.   Assessment and Plan:  Pregnancy: G1P0 at [redacted]w[redacted]d 1. [redacted] weeks gestation of pregnancy 2. Supervision of other normal pregnancy, antepartum Reviewed prenatal labd, no concerns except urine culture not resulted. Will resend sample. Declined AFP screen for now, already had ultrasound scheduled on 12/07/20. - Culture, OB Urine No other complaints or concerns.  Routine obstetric precautions reviewed. Please refer to After Visit Summary for other counseling recommendations.   Return in about 4 weeks (around 12/13/2020) for OFFICE OB  VISIT (MD only).  Future Appointments  Date Time Provider [place]  12/07/2020  1:45 PM [facility] US5 [facility] [place]    Verita Schneiders, MD

## 2020-11-15 NOTE — Patient Instructions (Signed)
Second Trimester of Pregnancy  The second trimester of pregnancy is from week 13 through week 27. This is months 4 through 6 of pregnancy. The second trimester is often a time when you feel your best. Your body has adjusted to being pregnant, and you begin to feel better physically. During the second trimester:  Morning sickness has lessened or stopped completely.  You may have more energy.  You may have an increase in appetite. The second trimester is also a time when the unborn baby (fetus) is growing rapidly. At the end of the sixth month, the fetus may be up to 12 inches long and weigh about 1 pounds. You will likely begin to feel the baby move (quickening) between 16 and 20 weeks of pregnancy. Body changes during your second trimester Your body continues to go through many changes during your second trimester. The changes vary and generally return to normal after the baby is born. Physical changes  Your weight will continue to increase. You will notice your lower abdomen bulging out.  You may begin to get stretch marks on your hips, abdomen, and breasts.  Your breasts will continue to grow and to become tender.  Dark spots or blotches (chloasma or mask of pregnancy) may develop on your face.  A dark line from your belly button to the pubic area (linea nigra) may appear.  You may have changes in your hair. These can include thickening of your hair, rapid growth, and changes in texture. Some people also have hair loss during or after pregnancy, or hair that feels dry or thin. Health changes  You may develop headaches.  You may have heartburn.  You may develop constipation.  You may develop hemorrhoids or swollen, bulging veins (varicose veins).  Your gums may bleed and may be sensitive to brushing and flossing.  You may urinate more often because the fetus is pressing on your bladder.  You may have back pain. This is caused by: ? Weight gain. ? Pregnancy hormones that  are relaxing the joints in your pelvis. ? A shift in weight and the muscles that support your balance. Follow these instructions at home: Medicines  Follow your health care provider's instructions regarding medicine use. Specific medicines may be either safe or unsafe to take during pregnancy. Do not take any medicines unless approved by your health care provider.  Take a prenatal vitamin that contains at least 600 micrograms (mcg) of folic acid. Eating and drinking  Eat a healthy diet that includes fresh fruits and vegetables, whole grains, good sources of protein such as meat, eggs, or tofu, and low-fat dairy products.  Avoid raw meat and unpasteurized juice, milk, and cheese. These carry germs that can harm you and your baby.  You may need to take these actions to prevent or treat constipation: ? Drink enough fluid to keep your urine pale yellow. ? Eat foods that are high in fiber, such as beans, whole grains, and fresh fruits and vegetables. ? Limit foods that are high in fat and processed sugars, such as fried or sweet foods. Activity  Exercise only as directed by your health care provider. Most people can continue their usual exercise routine during pregnancy. Try to exercise for 30 minutes at least 5 days a week. Stop exercising if you develop contractions in your uterus.  Stop exercising if you develop pain or cramping in the lower abdomen or lower back.  Avoid exercising if it is very hot or humid or if you are at  a high altitude.  Avoid heavy lifting.  If you choose to, you may have sex unless your health care provider tells you not to. Relieving pain and discomfort  Wear a supportive bra to prevent discomfort from breast tenderness.  Take warm sitz baths to soothe any pain or discomfort caused by hemorrhoids. Use hemorrhoid cream if your health care provider approves.  Rest with your legs raised (elevated) if you have leg cramps or low back pain.  If you develop  varicose veins: ? Wear support hose as told by your health care provider. ? Elevate your feet for 15 minutes, 3-4 times a day. ? Limit salt in your diet. Safety  Wear your seat belt at all times when driving or riding in a car.  Talk with your health care provider if someone is verbally or physically abusive to you. Lifestyle  Do not use hot tubs, steam rooms, or saunas.  Do not douche. Do not use tampons or scented sanitary pads.  Avoid cat litter boxes and soil used by cats. These carry germs that can cause birth defects in the baby and possibly loss of the fetus by miscarriage or stillbirth.  Do not use herbal remedies, alcohol, illegal drugs, or medicines that are not approved by your health care provider. Chemicals in these products can harm your baby.  Do not use any products that contain nicotine or tobacco, such as cigarettes, e-cigarettes, and chewing tobacco. If you need help quitting, ask your health care provider. General instructions  During a routine prenatal visit, your health care provider will do a physical exam and other tests. He or she will also discuss your overall health. Keep all follow-up visits. This is important.  Ask your health care provider for a referral to a local prenatal education class.  Ask for help if you have counseling or nutritional needs during pregnancy. Your health care provider can offer advice or refer you to specialists for help with various needs. Where to find more information  American Pregnancy Association: americanpregnancy.[place] and Gynecologists: PoolDevices.com.pt  Office on [employer]: KeywordPortfolios.com.br Contact a health care provider if you have:  A headache that does not go away when you take medicine.  Vision changes or you see spots in front of your eyes.  Mild pelvic cramps, pelvic pressure, or nagging pain in the abdominal area.  Persistent nausea,  vomiting, or diarrhea.  A bad-smelling vaginal discharge or foul-smelling urine.  Pain when you urinate.  Sudden or extreme swelling of your face, hands, ankles, feet, or legs.  A fever. Get help right away if you:  Have fluid leaking from your vagina.  Have spotting or bleeding from your vagina.  Have severe abdominal cramping or pain.  Have difficulty breathing.  Have chest pain.  Have fainting spells.  Have not felt your baby move for the time period told by your health care provider.  Have new or increased pain, swelling, or redness in an arm or leg. Summary  The second trimester of pregnancy is from week 13 through week 27 (months 4 through 6).  Do not use herbal remedies, alcohol, illegal drugs, or medicines that are not approved by your health care provider. Chemicals in these products can harm your baby.  Exercise only as directed by your health care provider. Most people can continue their usual exercise routine during pregnancy.  Keep all follow-up visits. This is important. This information is not intended to replace advice given to you by your  health care provider. Make sure you discuss any questions you have with your health care provider. Document Revised: 01/19/2020 Document Reviewed: 11/25/2019 Elsevier Patient Education  2021 [employer].

## 2020-11-17 LAB — CULTURE, OB URINE

## 2020-11-17 LAB — URINE CULTURE, OB REFLEX

## 2020-11-28 ENCOUNTER — Encounter: Payer: Self-pay | Admitting: *Deleted

## 2020-11-30 ENCOUNTER — Encounter: Payer: Self-pay | Admitting: *Deleted

## 2020-12-07 ENCOUNTER — Ambulatory Visit: Payer: Medicaid Other | Attending: Family Medicine

## 2020-12-07 ENCOUNTER — Other Ambulatory Visit: Payer: Self-pay

## 2020-12-07 DIAGNOSIS — Z3480 Encounter for supervision of other normal pregnancy, unspecified trimester: Secondary | ICD-10-CM | POA: Diagnosis not present

## 2020-12-07 NOTE — Progress Notes (Unsigned)
Ms. Dant is a G1P0 who is here for a detailed ultrasound. She is dated by a 9w 4 d ultrasound  Her medical history is uncomplicated.  Today we observed single intruauterine pregnancy that is consistent with dates. The detailed anatomy was normal with exception of bilateral choroid plexus cyst.  There were no additional markers of aneuploidy. There was good fetal movement and aminiotic fluid volume. Suboptimal views of the fetal anatomy was observed secondary to fetal position  I reviewed today's ultrasound and discussed that the choroid plexus cysts (CPCs) are well-demarcated, anechoic, fluid-filled structures within the choroid plexus of the lateral ventricles of the brain. They are not true cysts in the pathologic sense. CPCs are often called "soft sonographic signs" or "markers" of aneuploidy, because some studies have found an association between them and fetal chromosomal abnormalities. However, as more knowledge has accumulated, the initial importance attributed to this common sonographic finding has diminished. When the CPCs are isolated, some consider them an anatomic variant.  The incidence ranges from 0.18% to 3.6% of fetuses scanned in the second trimester. Regardless, the potential clinical implications were reviewed with your patient. CPCs may be an isolated finding or associated with fetal anomalies. Associated anomalies are typically those seen with trisomy 18 and include congenital heart disease, clenched hands, single umbilical artery, intrauterine growth restriction, and rocker bottom feet. A CPC is not a congenital brain defect.  In general, when a choroid plexus cyst is noted as an isolated finding and no other high-risk issues are noted, invasive karyotype analysis via an amniocentesis is usually not recommended.   Ms. Dickerson has a low risk NIPS and after counseling declined additional testing.   I spent 30 minutes with 50% in face to face consultation.  All questions  answered.  Follow up growth was scheduled in 4-6 weeks to complete the fetal anatomy.   Vikki Ports, MD

## 2020-12-08 ENCOUNTER — Other Ambulatory Visit: Payer: Medicaid Other

## 2020-12-13 ENCOUNTER — Ambulatory Visit (INDEPENDENT_AMBULATORY_CARE_PROVIDER_SITE_OTHER): Payer: Medicaid Other | Admitting: Obstetrics and Gynecology

## 2020-12-13 ENCOUNTER — Other Ambulatory Visit: Payer: Self-pay

## 2020-12-13 VITALS — BP 115/68 | HR 56 | Wt 182.5 lb

## 2020-12-13 DIAGNOSIS — Z3A19 19 weeks gestation of pregnancy: Secondary | ICD-10-CM

## 2020-12-13 DIAGNOSIS — F988 Other specified behavioral and emotional disorders with onset usually occurring in childhood and adolescence: Secondary | ICD-10-CM

## 2020-12-13 DIAGNOSIS — Z3480 Encounter for supervision of other normal pregnancy, unspecified trimester: Secondary | ICD-10-CM

## 2020-12-13 DIAGNOSIS — O219 Vomiting of pregnancy, unspecified: Secondary | ICD-10-CM

## 2020-12-13 MED ORDER — DOXYLAMINE-PYRIDOXINE 10-10 MG PO TBEC: 2.0000 | DELAYED_RELEASE_TABLET | Freq: Every evening | ORAL | 4 refills | Status: AC

## 2020-12-13 NOTE — Progress Notes (Signed)
   PRENATAL VISIT NOTE  Subjective:  Morgan Duke is a 20 y.o. G1P0 at [redacted]w[redacted]d being seen today for ongoing prenatal care.  She is currently monitored for the following issues for this low-risk pregnancy and has Amenorrhea; Supervision of other normal pregnancy, antepartum; ADD (attention deficit disorder); Anxiety and depression; and [redacted] weeks gestation of pregnancy on their problem list.  Patient doing well with no acute concerns today. She reports occasional dizzy stints.  Contractions: Not present. Vag. Bleeding: None.  Movement: Present. Denies leaking of fluid.   The following portions of the patient's history were reviewed and updated as appropriate: allergies, current medications, past family history, past medical history, past social history, past surgical history and problem list. Problem list updated.  Objective:   Vitals:   12/13/20 1041  BP: 115/68  Pulse: (!) 56  Weight: 182 lb 8 oz (82.8 kg)    Fetal Status: Fetal Heart Rate (bpm): 147   Movement: Present     General:  Alert, oriented and cooperative. Patient is in no acute distress.  Skin: Skin is warm and dry. No rash noted.   Cardiovascular: Normal heart rate noted  Respiratory: Normal respiratory effort, no problems with respiration noted  Abdomen: Soft, gravid, appropriate for gestational age.  Pain/Pressure: Absent     Pelvic: Cervical exam deferred        Extremities: Normal range of motion.  Edema: None  Mental Status:  Normal mood and affect. Normal behavior. Normal judgment and thought content.   Assessment and Plan:  Pregnancy: G1P0 at [redacted]w[redacted]d  1. Supervision of other normal pregnancy, antepartum Per MFM follow up anatomy scan needed, scan was ordered, Pt declines AFP today, but will come ion next week. - [place] MFM OB FOLLOW UP; Future - AFP, Serum, Open Spina Bifida; Future  2. Attention deficit disorder, unspecified hyperactivity presence   3. [redacted] weeks gestation of pregnancy   4. Nausea and vomiting  during pregnancy Pt does not like the way phenergan makes her feel.  Per pt zofran is sometimes ineffective. - Doxylamine-Pyridoxine (DICLEGIS) 10-10 MG TBEC; Take 2 tablets by mouth every evening. Ok to take one more tablet in the morning if still nauseated  Dispense: 60 tablet; Refill: 4  Preterm labor symptoms and general obstetric precautions including but not limited to vaginal bleeding, contractions, leaking of fluid and fetal movement were reviewed in detail with the patient.  Please refer to After Visit Summary for other counseling recommendations.   Return in about 4 weeks (around 01/10/2021) for ROB, in person.   Lynnda Shields, MD Faculty Attending [facility]

## 2020-12-13 NOTE — Progress Notes (Signed)
Pt states the other day when she stood up got super lightheaded & could not hear anything, only has happened once this past Friday. Pt states is still having nausea.

## 2020-12-18 ENCOUNTER — Other Ambulatory Visit: Payer: Self-pay

## 2020-12-18 ENCOUNTER — Other Ambulatory Visit: Payer: Medicaid Other

## 2020-12-18 DIAGNOSIS — Z3480 Encounter for supervision of other normal pregnancy, unspecified trimester: Secondary | ICD-10-CM

## 2020-12-20 ENCOUNTER — Other Ambulatory Visit: Payer: Self-pay

## 2020-12-20 ENCOUNTER — Emergency Department (HOSPITAL_COMMUNITY)
Admission: EM | Admit: 2020-12-20 | Discharge: 2020-12-20 | Disposition: A | Discharge status: Home / Self Care | Payer: Medicaid Other | Attending: Emergency Medicine | Admitting: Emergency Medicine

## 2020-12-20 DIAGNOSIS — Z3A20 20 weeks gestation of pregnancy: Secondary | ICD-10-CM | POA: Insufficient documentation

## 2020-12-20 DIAGNOSIS — M545 Low back pain, unspecified: Secondary | ICD-10-CM

## 2020-12-20 DIAGNOSIS — O26892 Other specified pregnancy related conditions, second trimester: Secondary | ICD-10-CM | POA: Diagnosis not present

## 2020-12-20 DIAGNOSIS — O2942 Spinal and epidural anesthesia induced headache during pregnancy, second trimester: Secondary | ICD-10-CM | POA: Insufficient documentation

## 2020-12-20 DIAGNOSIS — O9A212 Injury, poisoning and certain other consequences of external causes complicating pregnancy, second trimester: Secondary | ICD-10-CM | POA: Diagnosis present

## 2020-12-20 DIAGNOSIS — M542 Cervicalgia: Secondary | ICD-10-CM

## 2020-12-20 LAB — AFP, SERUM, OPEN SPINA BIFIDA
AFP MoM: 0.75
AFP Value: 42 ng/mL
Gest. Age on Collection Date: 20.3 weeks
Maternal Age At EDD: 21.1 yr
OSBR Risk 1 IN: 10000
Test Results:: NEGATIVE
Weight: 177 [lb_av]
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
## 2020-12-20 NOTE — ED Notes (Signed)
All appropriate discharge materials reviewed at length with patient. Time for questions provided. Pt has no other questions at this time and verbalizes understanding of all provided materials.  

## 2020-12-20 NOTE — Discharge Instructions (Addendum)
You have been seen and discharged from the emergency department.  Keep the soft collar in place, you may remove it to shower.  Take Tylenol as needed for pain control.  Follow-up with your primary provider for reevaluation and further care.  Call your OB/GYN and establish a follow-up appointment for renal ultrasound of baby.   If you have any worsening symptoms, severe pain, vaginal bleeding or further concerns for your health please call you OBGYN immediately/return to an emergency department for further evaluation.
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
## 2021-01-16 ENCOUNTER — Other Ambulatory Visit (HOSPITAL_COMMUNITY)
Admission: RE | Admit: 2021-01-16 | Discharge: 2021-01-16 | Disposition: A | Discharge status: Home / Self Care | Payer: Medicaid Other | Source: Ambulatory Visit | Attending: Family Medicine | Admitting: Family Medicine

## 2021-01-16 ENCOUNTER — Encounter: Payer: Self-pay | Admitting: Family Medicine

## 2021-01-16 ENCOUNTER — Ambulatory Visit (INDEPENDENT_AMBULATORY_CARE_PROVIDER_SITE_OTHER): Payer: Medicaid Other | Admitting: Family Medicine

## 2021-01-16 ENCOUNTER — Other Ambulatory Visit: Payer: Self-pay

## 2021-01-16 VITALS — BP 116/68 | HR 77 | Wt 188.9 lb

## 2021-01-16 DIAGNOSIS — T7491XD Unspecified adult maltreatment, confirmed, subsequent encounter: Secondary | ICD-10-CM

## 2021-01-16 DIAGNOSIS — Z3480 Encounter for supervision of other normal pregnancy, unspecified trimester: Secondary | ICD-10-CM

## 2021-01-16 DIAGNOSIS — T7491XA Unspecified adult maltreatment, confirmed, initial encounter: Secondary | ICD-10-CM | POA: Insufficient documentation

## 2021-01-16 NOTE — Progress Notes (Signed)
Pregnancy Screening Risk Form completed.

## 2021-01-16 NOTE — Addendum Note (Signed)
Addended byMariane Baumgarten on: 01/16/2021 09:54 AM   Modules accepted: Orders

## 2021-01-16 NOTE — Patient Instructions (Signed)
Contraception Choices Contraception, also called birth control, refers to methods or devices that prevent pregnancy. Hormonal methods Contraceptive implant A contraceptive implant is a thin, plastic tube that contains a hormone that prevents pregnancy. It is different from an intrauterine device (IUD). It is inserted into the upper part of the arm by a health care provider. Implants can be effective for up to 3 years. Progestin-only injections Progestin-only injections are injections of progestin, a synthetic form of the hormone progesterone. They are given every 3 months by a health care provider. Birth control pills Birth control pills are pills that contain hormones that prevent pregnancy. They must be taken once a day, preferably at the same time each day. A prescription is needed to use this method of contraception. Birth control patch The birth control patch contains hormones that prevent pregnancy. It is placed on the skin and must be changed once a week for three weeks and removed on the fourth week. A prescription is needed to use this method of contraception. Vaginal ring A vaginal ring contains hormones that prevent pregnancy. It is placed in the vagina for three weeks and removed on the fourth week. After that, the process is repeated with a new ring. A prescription is needed to use this method of contraception. Emergency contraceptive Emergency contraceptives prevent pregnancy after unprotected sex. They come in pill form and can be taken up to 5 days after sex. They work best the sooner they are taken after having sex. Most emergency contraceptives are available without a prescription. This method should not be used as your only form of birth control.   Barrier methods Female condom A female condom is a thin sheath that is worn over the penis during sex. Condoms keep sperm from going inside a woman's body. They can be used with a sperm-killing substance (spermicide) to increase their  effectiveness. They should be thrown away after one use. Female condom A female condom is a soft, loose-fitting sheath that is put into the vagina before sex. The condom keeps sperm from going inside a woman's body. They should be thrown away after one use. Diaphragm A diaphragm is a soft, dome-shaped barrier. It is inserted into the vagina before sex, along with a spermicide. The diaphragm blocks sperm from entering the uterus, and the spermicide kills sperm. A diaphragm should be left in the vagina for 6-8 hours after sex and removed within 24 hours. A diaphragm is prescribed and fitted by a health care provider. A diaphragm should be replaced every 1-2 years, after giving birth, after gaining more than 15 lb (6.8 kg), and after pelvic surgery. Cervical cap A cervical cap is a round, soft latex or plastic cup that fits over the cervix. It is inserted into the vagina before sex, along with spermicide. It blocks sperm from entering the uterus. The cap should be left in place for 6-8 hours after sex and removed within 48 hours. A cervical cap must be prescribed and fitted by a health care provider. It should be replaced every 2 years. Sponge A sponge is a soft, circular piece of polyurethane foam with spermicide in it. The sponge helps block sperm from entering the uterus, and the spermicide kills sperm. To use it, you make it wet and then insert it into the vagina. It should be inserted before sex, left in for at least 6 hours after sex, and removed and thrown away within 30 hours. Spermicides Spermicides are chemicals that kill or block sperm from entering the  cervix and uterus. They can come as a cream, jelly, suppository, foam, or tablet. A spermicide should be inserted into the vagina with an applicator at least 34-19 minutes before sex to allow time for it to work. The process must be repeated every time you have sex. Spermicides do not require a prescription.   Intrauterine  contraception Intrauterine device (IUD) An IUD is a T-shaped device that is put in a woman's uterus. There are two types:  Hormone IUD.This type contains progestin, a synthetic form of the hormone progesterone. This type can stay in place for 3-5 years.  Copper IUD.This type is wrapped in copper wire. It can stay in place for 10 years. Permanent methods of contraception Female tubal ligation In this method, a woman's fallopian tubes are sealed, tied, or blocked during surgery to prevent eggs from traveling to the uterus. Hysteroscopic sterilization In this method, a small, flexible insert is placed into each fallopian tube. The inserts cause scar tissue to form in the fallopian tubes and block them, so sperm cannot reach an egg. The procedure takes about 3 months to be effective. Another form of birth control must be used during those 3 months. Female sterilization This is a procedure to tie off the tubes that carry sperm (vasectomy). After the procedure, the man can still ejaculate fluid (semen). Another form of birth control must be used for 3 months after the procedure. Natural planning methods Natural family planning In this method, a couple does not have sex on days when the woman could become pregnant. Calendar method In this method, the woman keeps track of the length of each menstrual cycle, identifies the days when pregnancy can happen, and does not have sex on those days. Ovulation method In this method, a couple avoids sex during ovulation. Symptothermal method This method involves not having sex during ovulation. The woman typically checks for ovulation by watching changes in her temperature and in the consistency of cervical mucus. Post-ovulation method In this method, a couple waits to have sex until after ovulation. Where to find more information  Centers for Disease Control and Prevention: http://www.wolf.info/ Summary  Contraception, also called birth control, refers to methods or  devices that prevent pregnancy.  Hormonal methods of contraception include implants, injections, pills, patches, vaginal rings, and emergency contraceptives.  Barrier methods of contraception can include female condoms, female condoms, diaphragms, cervical caps, sponges, and spermicides.  There are two types of IUDs (intrauterine devices). An IUD can be put in a woman's uterus to prevent pregnancy for 3-5 years.  Permanent sterilization can be done through a procedure for males and females. Natural family planning methods involve nothaving sex on days when the woman could become pregnant. This information is not intended to replace advice given to you by your health care provider. Make sure you discuss any questions you have with your health care provider. Document Revised: 01/17/2020 Document Reviewed: 01/17/2020 Elsevier Patient Education  Morgan Duke.   Breastfeeding  Choosing to breastfeed is one of the best decisions you can make for yourself and your baby. A change in hormones during pregnancy causes your breasts to make breast milk in your milk-producing glands. Hormones prevent breast milk from being released before your baby is born. They also prompt milk flow after birth. Once breastfeeding has begun, thoughts of your baby, as well as his or her sucking or crying, can stimulate the release of milk from your milk-producing glands. Benefits of breastfeeding Research shows that breastfeeding offers many health benefits  for infants and mothers. It also offers a cost-free and convenient way to feed your baby. For your baby  Your first milk (colostrum) helps your baby's digestive system to function better.  Special cells in your milk (antibodies) help your baby to fight off infections.  Breastfed babies are less likely to develop asthma, allergies, obesity, or type 2 diabetes. They are also at lower risk for sudden infant death syndrome (SIDS).  Nutrients in breast milk are better  able to meet your baby's needs compared to infant formula.  Breast milk improves your baby's brain development. For you  Breastfeeding helps to create a very special bond between you and your baby.  Breastfeeding is convenient. Breast milk costs nothing and is always available at the correct temperature.  Breastfeeding helps to burn calories. It helps you to lose the weight that you gained during pregnancy.  Breastfeeding makes your uterus return faster to its size before pregnancy. It also slows bleeding (lochia) after you give birth.  Breastfeeding helps to lower your risk of developing type 2 diabetes, osteoporosis, rheumatoid arthritis, cardiovascular disease, and breast, ovarian, uterine, and endometrial cancer later in life. Breastfeeding basics Starting breastfeeding  Find a comfortable place to sit or lie down, with your neck and back well-supported.  Place a pillow or a rolled-up blanket under your baby to bring him or her to the level of your breast (if you are seated). Nursing pillows are specially designed to help support your arms and your baby while you breastfeed.  Make sure that your baby's tummy (abdomen) is facing your abdomen.  Gently massage your breast. With your fingertips, massage from the outer edges of your breast inward toward the nipple. This encourages milk flow. If your milk flows slowly, you may need to continue this action during the feeding.  Support your breast with 4 fingers underneath and your thumb above your nipple (make the letter "C" with your hand). Make sure your fingers are well away from your nipple and your baby's mouth.  Stroke your baby's lips gently with your finger or nipple.  When your baby's mouth is open wide enough, quickly bring your baby to your breast, placing your entire nipple and as much of the areola as possible into your baby's mouth. The areola is the colored area around your nipple. ? More areola should be visible above your  baby's upper lip than below the lower lip. ? Your baby's lips should be opened and extended outward (flanged) to ensure an adequate, comfortable latch. ? Your baby's tongue should be between his or her lower gum and your breast.  Make sure that your baby's mouth is correctly positioned around your nipple (latched). Your baby's lips should create a seal on your breast and be turned out (everted).  It is common for your baby to suck about 2-3 minutes in order to start the flow of breast milk. Latching Teaching your baby how to latch onto your breast properly is very important. An improper latch can cause nipple pain, decreased milk supply, and poor weight gain in your baby. Also, if your baby is not latched onto your nipple properly, he or she may swallow some air during feeding. This can make your baby fussy. Burping your baby when you switch breasts during the feeding can help to get rid of the air. However, teaching your baby to latch on properly is still the best way to prevent fussiness from swallowing air while breastfeeding. Signs that your baby has successfully latched onto  your nipple  Silent tugging or silent sucking, without causing you pain. Infant's lips should be extended outward (flanged).  Swallowing heard between every 3-4 sucks once your milk has started to flow (after your let-down milk reflex occurs).  Muscle movement above and in front of his or her ears while sucking. Signs that your baby has not successfully latched onto your nipple  Sucking sounds or smacking sounds from your baby while breastfeeding.  Nipple pain. If you think your baby has not latched on correctly, slip your finger into the corner of your baby's mouth to break the suction and place it between your baby's gums. Attempt to start breastfeeding again. Signs of successful breastfeeding Signs from your baby  Your baby will gradually decrease the number of sucks or will completely stop sucking.  Your baby  will fall asleep.  Your baby's body will relax.  Your baby will retain a small amount of milk in his or her mouth.  Your baby will let go of your breast by himself or herself. Signs from you  Breasts that have increased in firmness, weight, and size 1-3 hours after feeding.  Breasts that are softer immediately after breastfeeding.  Increased milk volume, as well as a change in milk consistency and color by the fifth day of breastfeeding.  Nipples that are not sore, cracked, or bleeding. Signs that your baby is getting enough milk  Wetting at least 1-2 diapers during the first 24 hours after birth.  Wetting at least 5-6 diapers every 24 hours for the first week after birth. The urine should be clear or pale yellow by the age of 5 days.  Wetting 6-8 diapers every 24 hours as your baby continues to grow and develop.  At least 3 stools in a 24-hour period by the age of 5 days. The stool should be soft and yellow.  At least 3 stools in a 24-hour period by the age of 7 days. The stool should be seedy and yellow.  No loss of weight greater than 10% of birth weight during the first 3 days of life.  Average weight gain of 4-7 oz (113-198 g) per week after the age of 4 days.  Consistent daily weight gain by the age of 5 days, without weight loss after the age of 2 weeks. After a feeding, your baby may spit up a small amount of milk. This is normal. Breastfeeding frequency and duration Frequent feeding will help you make more milk and can prevent sore nipples and extremely full breasts (breast engorgement). Breastfeed when you feel the need to reduce the fullness of your breasts or when your baby shows signs of hunger. This is called "breastfeeding on demand." Signs that your baby is hungry include:  Increased alertness, activity, or restlessness.  Movement of the head from side to side.  Opening of the mouth when the corner of the mouth or cheek is stroked (rooting).  Increased  sucking sounds, smacking lips, cooing, sighing, or squeaking.  Hand-to-mouth movements and sucking on fingers or hands.  Fussing or crying. Avoid introducing a pacifier to your baby in the first 4-6 weeks after your baby is born. After this time, you may choose to use a pacifier. Research has shown that pacifier use during the first year of a baby's life decreases the risk of sudden infant death syndrome (SIDS). Allow your baby to feed on each breast as long as he or she wants. When your baby unlatches or falls asleep while feeding from the  first breast, offer the second breast. Because newborns are often sleepy in the first few weeks of life, you may need to awaken your baby to get him or her to feed. Breastfeeding times will vary from baby to baby. However, the following rules can serve as a guide to help you make sure that your baby is properly fed:  Newborns (babies 67 weeks of age or younger) may breastfeed every 1-3 hours.  Newborns should not go without breastfeeding for longer than 3 hours during the day or 5 hours during the night.  You should breastfeed your baby a minimum of 8 times in a 24-hour period. Breast milk pumping Pumping and storing breast milk allows you to make sure that your baby is exclusively fed your breast milk, even at times when you are unable to breastfeed. This is especially important if you go back to work while you are still breastfeeding, or if you are not able to be present during feedings. Your lactation consultant can help you find a method of pumping that works best for you and give you guidelines about how long it is safe to store breast milk.      Caring for your breasts while you breastfeed Nipples can become dry, cracked, and sore while breastfeeding. The following recommendations can help keep your breasts moisturized and healthy:  Avoid using soap on your nipples.  Wear a supportive bra designed especially for nursing. Avoid wearing underwire-style  bras or extremely tight bras (sports bras).  Air-dry your nipples for 3-4 minutes after each feeding.  Use only cotton bra pads to absorb leaked breast milk. Leaking of breast milk between feedings is normal.  Use lanolin on your nipples after breastfeeding. Lanolin helps to maintain your skin's normal moisture barrier. Pure lanolin is not harmful (not toxic) to your baby. You may also hand express a few drops of breast milk and gently massage that milk into your nipples and allow the milk to air-dry. In the first few weeks after giving birth, some women experience breast engorgement. Engorgement can make your breasts feel heavy, warm, and tender to the touch. Engorgement peaks within 3-5 days after you give birth. The following recommendations can help to ease engorgement:  Completely empty your breasts while breastfeeding or pumping. You may want to start by applying warm, moist heat (in the shower or with warm, water-soaked hand towels) just before feeding or pumping. This increases circulation and helps the milk flow. If your baby does not completely empty your breasts while breastfeeding, pump any extra milk after he or she is finished.  Apply ice packs to your breasts immediately after breastfeeding or pumping, unless this is too uncomfortable for you. To do this: ? Put ice in a plastic bag. ? Place a towel between your skin and the bag. ? Leave the ice on for 20 minutes, 2-3 times a day.  Make sure that your baby is latched on and positioned properly while breastfeeding. If engorgement persists after 48 hours of following these recommendations, contact your health care provider or a Science writer. Overall health care recommendations while breastfeeding  Eat 3 healthy meals and 3 snacks every day. Well-nourished mothers who are breastfeeding need an additional 450-500 calories a day. You can meet this requirement by increasing the amount of a balanced diet that you eat.  Drink  enough water to keep your urine pale yellow or clear.  Rest often, relax, and continue to take your prenatal vitamins to prevent fatigue, stress, and low  vitamin and mineral levels in your body (nutrient deficiencies).  Do not use any products that contain nicotine or tobacco, such as cigarettes and e-cigarettes. Your baby may be harmed by chemicals from cigarettes that pass into breast milk and exposure to secondhand smoke. If you need help quitting, ask your health care provider.  Avoid alcohol.  Do not use illegal drugs or marijuana.  Talk with your health care provider before taking any medicines. These include over-the-counter and prescription medicines as well as vitamins and herbal supplements. Some medicines that may be harmful to your baby can pass through breast milk.  It is possible to become pregnant while breastfeeding. If birth control is desired, ask your health care provider about options that will be safe while breastfeeding your baby. Where to find more information: [employer]: www.llli.org Contact a health care provider if:  You feel like you want to stop breastfeeding or have become frustrated with breastfeeding.  Your nipples are cracked or bleeding.  Your breasts are red, tender, or warm.  You have: ? Painful breasts or nipples. ? A swollen area on either breast. ? A fever or chills. ? Nausea or vomiting. ? Drainage other than breast milk from your nipples.  Your breasts do not become full before feedings by the fifth day after you give birth.  You feel sad and depressed.  Your baby is: ? Too sleepy to eat well. ? Having trouble sleeping. ? More than 82 week old and wetting fewer than 6 diapers in a 24-hour period. ? Not gaining weight by 79 days of age.  Your baby has fewer than 3 stools in a 24-hour period.  Your baby's skin or the white parts of his or her eyes become yellow. Get help right away if:  Your baby is overly tired  (lethargic) and does not want to wake up and feed.  Your baby develops an unexplained fever. Summary  Breastfeeding offers many health benefits for infant and mothers.  Try to breastfeed your infant when he or she shows early signs of hunger.  Gently tickle or stroke your baby's lips with your finger or nipple to allow the baby to open his or her mouth. Bring the baby to your breast. Make sure that much of the areola is in your baby's mouth. Offer one side and burp the baby before you offer the other side.  Talk with your health care provider or lactation consultant if you have questions or you face problems as you breastfeed. This information is not intended to replace advice given to you by your health care provider. Make sure you discuss any questions you have with your health care provider. Document Revised: 11/06/2017 Document Reviewed: 09/13/2016 Elsevier Patient Education  2021 [employer].
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
## 2021-01-17 LAB — GC/CHLAMYDIA PROBE AMP (~~LOC~~) NOT AT ARMC
Chlamydia: NEGATIVE
Comment: NEGATIVE
Comment: NORMAL
Neisseria Gonorrhea: NEGATIVE

## 2021-01-31 ENCOUNTER — Other Ambulatory Visit: Payer: Self-pay

## 2021-01-31 ENCOUNTER — Ambulatory Visit: Payer: Medicaid Other | Attending: Obstetrics and Gynecology

## 2021-01-31 DIAGNOSIS — Z3480 Encounter for supervision of other normal pregnancy, unspecified trimester: Secondary | ICD-10-CM | POA: Diagnosis present

## 2021-02-16 ENCOUNTER — Encounter: Payer: Medicaid Other | Admitting: Medical
